# Patient Record
Sex: Male | Born: 1935 | Race: White | Hispanic: No | Smoking: Never smoker
Health system: Southern US, Community
[De-identification: ages and names within clinical notes are randomized; demographics above are authoritative.]

## PROBLEM LIST (undated history)

## (undated) DIAGNOSIS — C679 Malignant neoplasm of bladder, unspecified: Secondary | ICD-10-CM

## (undated) DIAGNOSIS — I10 Essential (primary) hypertension: Secondary | ICD-10-CM

## (undated) DIAGNOSIS — K635 Polyp of colon: Secondary | ICD-10-CM

## (undated) DIAGNOSIS — G5600 Carpal tunnel syndrome, unspecified upper limb: Secondary | ICD-10-CM

## (undated) DIAGNOSIS — F329 Major depressive disorder, single episode, unspecified: Secondary | ICD-10-CM

## (undated) DIAGNOSIS — I482 Chronic atrial fibrillation, unspecified: Secondary | ICD-10-CM

## (undated) DIAGNOSIS — I639 Cerebral infarction, unspecified: Secondary | ICD-10-CM

## (undated) DIAGNOSIS — E785 Hyperlipidemia, unspecified: Secondary | ICD-10-CM

## (undated) DIAGNOSIS — J310 Chronic rhinitis: Secondary | ICD-10-CM

## (undated) DIAGNOSIS — H548 Legal blindness, as defined in USA: Secondary | ICD-10-CM

## (undated) DIAGNOSIS — R42 Dizziness and giddiness: Secondary | ICD-10-CM

## (undated) DIAGNOSIS — R251 Tremor, unspecified: Secondary | ICD-10-CM

## (undated) DIAGNOSIS — F32A Depression, unspecified: Secondary | ICD-10-CM

## (undated) DIAGNOSIS — I2510 Atherosclerotic heart disease of native coronary artery without angina pectoris: Secondary | ICD-10-CM

## (undated) DIAGNOSIS — I739 Peripheral vascular disease, unspecified: Secondary | ICD-10-CM

## (undated) DIAGNOSIS — N400 Enlarged prostate without lower urinary tract symptoms: Secondary | ICD-10-CM

## (undated) DIAGNOSIS — B009 Herpesviral infection, unspecified: Secondary | ICD-10-CM

## (undated) DIAGNOSIS — M1990 Unspecified osteoarthritis, unspecified site: Secondary | ICD-10-CM

## (undated) DIAGNOSIS — I779 Disorder of arteries and arterioles, unspecified: Secondary | ICD-10-CM

## (undated) DIAGNOSIS — H409 Unspecified glaucoma: Secondary | ICD-10-CM

## (undated) HISTORY — DX: Unspecified osteoarthritis, unspecified site: M19.90

## (undated) HISTORY — DX: Benign prostatic hyperplasia without lower urinary tract symptoms: N40.0

## (undated) HISTORY — DX: Malignant neoplasm of bladder, unspecified: C67.9

## (undated) HISTORY — DX: Peripheral vascular disease, unspecified: I73.9

## (undated) HISTORY — DX: Chronic rhinitis: J31.0

## (undated) HISTORY — DX: Essential (primary) hypertension: I10

## (undated) HISTORY — DX: Hyperlipidemia, unspecified: E78.5

## (undated) HISTORY — DX: Tremor, unspecified: R25.1

## (undated) HISTORY — PX: EVISCERATION: SHX1539

## (undated) HISTORY — DX: Disorder of arteries and arterioles, unspecified: I77.9

## (undated) HISTORY — DX: Dizziness and giddiness: R42

## (undated) HISTORY — DX: Carpal tunnel syndrome, unspecified upper limb: G56.00

## (undated) HISTORY — DX: Depression, unspecified: F32.A

## (undated) HISTORY — DX: Legal blindness, as defined in USA: H54.8

## (undated) HISTORY — DX: Chronic atrial fibrillation, unspecified: I48.20

## (undated) HISTORY — DX: Cerebral infarction, unspecified: I63.9

## (undated) HISTORY — DX: Polyp of colon: K63.5

## (undated) HISTORY — PX: TRANSURETHRAL RESECTION OF PROSTATE: SHX73

## (undated) HISTORY — PX: CARPAL TUNNEL RELEASE: SHX101

## (undated) HISTORY — DX: Herpesviral infection, unspecified: B00.9

## (undated) HISTORY — DX: Unspecified glaucoma: H40.9

## (undated) HISTORY — DX: Atherosclerotic heart disease of native coronary artery without angina pectoris: I25.10

## (undated) HISTORY — PX: OTHER SURGICAL HISTORY: SHX169

## (undated) HISTORY — DX: Major depressive disorder, single episode, unspecified: F32.9

## (undated) HISTORY — PX: CATARACT EXTRACTION: SUR2

---

## 2002-09-03 ENCOUNTER — Ambulatory Visit (HOSPITAL_COMMUNITY): Admission: RE | Admit: 2002-09-03 | Discharge: 2002-09-03 | Payer: Self-pay | Admitting: Specialist

## 2003-10-07 ENCOUNTER — Encounter: Admission: RE | Admit: 2003-10-07 | Discharge: 2003-10-07 | Payer: Self-pay | Admitting: Orthopedic Surgery

## 2003-10-08 ENCOUNTER — Ambulatory Visit (HOSPITAL_COMMUNITY): Admission: RE | Admit: 2003-10-08 | Discharge: 2003-10-08 | Payer: Self-pay | Admitting: Orthopedic Surgery

## 2003-10-08 ENCOUNTER — Ambulatory Visit (HOSPITAL_BASED_OUTPATIENT_CLINIC_OR_DEPARTMENT_OTHER): Admission: RE | Admit: 2003-10-08 | Discharge: 2003-10-08 | Payer: Self-pay | Admitting: Orthopedic Surgery

## 2007-09-24 ENCOUNTER — Ambulatory Visit (HOSPITAL_COMMUNITY): Admission: AD | Admit: 2007-09-24 | Discharge: 2007-09-24 | Payer: Self-pay | Admitting: Ophthalmology

## 2008-01-27 ENCOUNTER — Ambulatory Visit (HOSPITAL_COMMUNITY): Admission: RE | Admit: 2008-01-27 | Discharge: 2008-01-27 | Payer: Self-pay | Admitting: Ophthalmology

## 2009-01-08 ENCOUNTER — Emergency Department (HOSPITAL_COMMUNITY): Admission: EM | Admit: 2009-01-08 | Discharge: 2009-01-08 | Payer: Self-pay | Admitting: Emergency Medicine

## 2009-02-19 ENCOUNTER — Emergency Department (HOSPITAL_COMMUNITY): Admission: EM | Admit: 2009-02-19 | Discharge: 2009-02-19 | Payer: Self-pay | Admitting: Emergency Medicine

## 2009-12-19 ENCOUNTER — Inpatient Hospital Stay (HOSPITAL_COMMUNITY): Admission: EM | Admit: 2009-12-19 | Discharge: 2009-12-25 | Payer: Self-pay | Admitting: Emergency Medicine

## 2009-12-21 ENCOUNTER — Encounter (INDEPENDENT_AMBULATORY_CARE_PROVIDER_SITE_OTHER): Payer: Self-pay | Admitting: Neurology

## 2009-12-21 ENCOUNTER — Ambulatory Visit: Payer: Self-pay | Admitting: Physical Medicine & Rehabilitation

## 2010-09-04 LAB — PROTIME-INR
INR: 1.22 (ref 0.00–1.49)
INR: 1.23 (ref 0.00–1.49)
INR: 2.89 — ABNORMAL HIGH (ref 0.00–1.49)
Prothrombin Time: 30 seconds — ABNORMAL HIGH (ref 11.6–15.2)

## 2010-09-04 LAB — BASIC METABOLIC PANEL
CO2: 24 mEq/L (ref 19–32)
CO2: 27 mEq/L (ref 19–32)
Chloride: 104 mEq/L (ref 96–112)
Chloride: 106 mEq/L (ref 96–112)
Chloride: 107 mEq/L (ref 96–112)
Creatinine, Ser: 0.91 mg/dL (ref 0.4–1.5)
Creatinine, Ser: 0.94 mg/dL (ref 0.4–1.5)
GFR calc Af Amer: >60 mL/min (ref >60)
GFR calc Af Amer: >60 mL/min (ref >60)
GFR calc Af Amer: >60 mL/min (ref >60)
GFR calc non Af Amer: >60 mL/min (ref >60)
Potassium: 3.9 mEq/L (ref 3.5–5.1)
Potassium: 4.8 mEq/L (ref 3.5–5.1)
Sodium: 140 mEq/L (ref 135–145)

## 2010-09-04 LAB — CBC
HCT: 40.3 % (ref 39.0–52.0)
Hemoglobin: 13.6 g/dL (ref 13.0–17.0)
Hemoglobin: 15.6 g/dL (ref 13.0–17.0)
MCH: 29.6 pg (ref 26.0–34.0)
MCHC: 34.1 g/dL (ref 30.0–36.0)
MCV: 101.9 fL — ABNORMAL HIGH (ref 78.0–100.0)
MCV: 87.8 fL (ref 78.0–100.0)
Platelets: 106 10*3/uL — ABNORMAL LOW (ref 150–400)
RBC: 3.87 MIL/uL — ABNORMAL LOW (ref 4.22–5.81)
RBC: 3.94 MIL/uL — ABNORMAL LOW (ref 4.22–5.81)
RBC: 3.95 MIL/uL — ABNORMAL LOW (ref 4.22–5.81)
RBC: 5.26 MIL/uL (ref 4.22–5.81)
RDW: 13.8 % (ref 11.5–15.5)
RDW: 14.1 % (ref 11.5–15.5)
WBC: 5.1 10*3/uL (ref 4.0–10.5)
WBC: 5.8 10*3/uL (ref 4.0–10.5)

## 2010-09-04 LAB — COMPREHENSIVE METABOLIC PANEL
ALT: 17 U/L (ref 0–53)
AST: 22 U/L (ref 0–37)
Albumin: 3.5 g/dL (ref 3.5–5.2)
Alkaline Phosphatase: 54 U/L (ref 39–117)
BUN: 29 mg/dL — ABNORMAL HIGH (ref 6–23)
CO2: 26 mEq/L (ref 19–32)
Calcium: 8.8 mg/dL (ref 8.4–10.5)
Chloride: 109 mEq/L (ref 96–112)
Creatinine, Ser: 1.1 mg/dL (ref 0.4–1.5)
GFR calc Af Amer: >60 mL/min (ref >60)
Glucose, Bld: 95 mg/dL (ref 70–99)
Sodium: 141 mEq/L (ref 135–145)
Total Bilirubin: 1 mg/dL (ref 0.3–1.2)
Total Protein: 6.9 g/dL (ref 6.0–8.3)

## 2010-09-04 LAB — DIFFERENTIAL
Basophils Relative: 0 % (ref 0–1)
Eosinophils Absolute: 0.1 10*3/uL (ref 0.0–0.7)
Monocytes Absolute: 0.6 10*3/uL (ref 0.1–1.0)

## 2010-09-04 LAB — APTT: aPTT: 32 seconds (ref 24–37)

## 2010-09-04 LAB — LIPID PANEL: VLDL: 10 mg/dL (ref 0–40)

## 2010-09-04 LAB — GLUCOSE, CAPILLARY: Glucose-Capillary: 96 mg/dL (ref 70–99)

## 2010-09-04 LAB — URINALYSIS, ROUTINE W REFLEX MICROSCOPIC
Bilirubin Urine: NEGATIVE
Ketones, ur: NEGATIVE mg/dL
Specific Gravity, Urine: 1.018 (ref 1.005–1.030)

## 2010-09-04 LAB — HEMOGLOBIN A1C
Hgb A1c MFr Bld: 5.9 % — ABNORMAL HIGH (ref <5.7)
Mean Plasma Glucose: 123 mg/dL — ABNORMAL HIGH (ref <117)

## 2010-09-04 LAB — URINE MICROSCOPIC-ADD ON

## 2010-09-04 LAB — TROPONIN I: Troponin I: 0.06 ng/mL (ref 0.00–0.06)

## 2010-09-04 LAB — CK TOTAL AND CKMB (NOT AT ARMC): CK, MB: 1.9 ng/mL (ref 0.3–4.0)

## 2010-09-23 LAB — URINALYSIS, ROUTINE W REFLEX MICROSCOPIC
Bilirubin Urine: NEGATIVE
Nitrite: NEGATIVE
Specific Gravity, Urine: 1.01 (ref 1.005–1.030)
Urobilinogen, UA: 0.2 mg/dL (ref 0.0–1.0)

## 2010-09-23 LAB — BASIC METABOLIC PANEL
Calcium: 8.9 mg/dL (ref 8.4–10.5)
Creatinine, Ser: 0.85 mg/dL (ref 0.4–1.5)
GFR calc Af Amer: >60 mL/min (ref >60)
GFR calc non Af Amer: >60 mL/min (ref >60)
Glucose, Bld: 106 mg/dL — ABNORMAL HIGH (ref 70–99)
Sodium: 139 mEq/L (ref 135–145)

## 2010-09-23 LAB — DIFFERENTIAL
Basophils Absolute: 0.1 10*3/uL (ref 0.0–0.1)
Lymphocytes Relative: 16 % (ref 12–46)
Monocytes Relative: 10 % (ref 3–12)
Neutro Abs: 4.6 10*3/uL (ref 1.7–7.7)
Neutrophils Relative %: 72 % (ref 43–77)

## 2010-09-23 LAB — PROTIME-INR
INR: 1.2 (ref 0.00–1.49)
Prothrombin Time: 15.1 seconds (ref 11.6–15.2)

## 2010-09-23 LAB — CBC
Hemoglobin: 13.9 g/dL (ref 13.0–17.0)
RDW: 13 % (ref 11.5–15.5)

## 2010-09-23 LAB — APTT: aPTT: 33 seconds (ref 24–37)

## 2010-11-01 NOTE — Op Note (Signed)
NAMEJOSEFF, LUCKMAN               ACCOUNT NO.:  [id number]   MEDICAL RECORD NO.:  [id number]          PATIENT TYPE:  AMB   LOCATION:  SDS                          FACILITY:  [facility]   PHYSICIAN:  Jillyn Hidden A. Rankin, M.D.   DATE OF BIRTH:  Dec 22, 1916   DATE OF PROCEDURE:  09/24/2007  DATE OF DISCHARGE:                               OPERATIVE REPORT   PREOPERATIVE DIAGNOSES:  1. 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  2. Hypopyon, left eye.  3. History history of herpes keratitis.  4. History of aqueous tube shunt in the left eye for intractable      glaucoma.  5. Possible subconjunctival abscess   POSTOPERATIVE DIAGNOSES:  1. 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  2. Hypopyon, left eye.  3. History history of herpes keratitis.  4. History of aqueous tube shunt in the left eye for intractable      glaucoma.  5. There is no apparent subconjunctival abscess although it did appear      to be on initial evaluation to the office.   PROCEDURE:  1. Posterior vitrectomy, left eye.  2. Injection of antibiotics--vancomycin 1.0 mg/0.1 mL concentration,      volume 0.1 mL vitreous and Fortaz 2.25 mg/0.1 mL concentration and      0.1 mL volume vitreous.  3. Posterior synechialysis, left eye.  4. Culture of the anterior chamber and vitreous cavity, left eye.   SURGEON:  Alford Highland. Rankin, MD   ANESTHESIA:  Local retrobulbar anesthesia under good control.   INDICATIONS FOR PROCEDURE:  The patient is an 77 year old man who has  profound vision loss in the left eye from count fingers, normal baseline  count fingers over the last 2 days, now to light perception with near  complete opacification of the cornea of the central corneal disciform-  looking scar, essentially with hypopyon uveitis with diffuse  subconjunctival hemorrhage and what appears to be superotemporally some  conjunctival abscess, loculated.  This an attempt to afford therapeutic  and diagnostic vitrectomy to culture the vitreous  cavity and the  anterior chamber, but also to deliver definitive antibiotics to the eye.  The patient understands the risks of anesthesia, __________ including  worsening condition, as well as surgical repair including, but not  limited to hemorrhage, infection, scarring, need for surgery, and change  of vision, loss of vision, or progressive disease despite intervention.  Appropriate signed consent was obtained.  The patient was taken to the  operating room.   PROCEDURE IN DETAIL:  In the operating room appropriate monitor was  followed by mild sedation.  Xylocaine 2% injected, 5 mL retrobulbar and  an additional 5 mL laterally placed in modified [employer]  The left  periocular region was then sterilely prepped and draped in the usual  ophthalmic fashion.  Lid speculum was applied.  At this time a 25 gauge  trocar was then placed in the inferotemporal quadrant.  Infusion was  then turned on briefly and then it was turned off.  Separate  paracentesis incision was made in the anterior chamber with a 30 gauge  needle and approximately 3 mL of mostly  yellowish fluid was removed from  the anterior chamber.  This was plated directly on sheep blood agar and  chocolate T agar, chocolate blood agar , and chocolate T agar, as well  as to anaerobic medium for transport.  Thereafter the anterior chamber  was reformed with BSS.  The posterior infusion was then turned back on.  At this time separate trocar was placed in the superior temporal  quadrant, away from what appeared to be initially a subconjunctival  abscess in the superotemporal quadrant, but it appeared more likely to  be the old scleral patch over the foot plate of the tube shunt this  patient had a history of having for glaucoma control.   Nonetheless the superotemporal quadrant was avoided for this reason.  The superonasal sclerotomy was then used and just the light microscope  itself was then used to guide this into the visual access  and this could  be seen through the mostly opaque cornea and the modified limited core  vitrectomy was then carried out.  The Biome was not used because it  could not be visualized adequately.  Mostly clear vitreous fluid, but  some yellowish-tinge was noted and this was sent and the cassette was  sent for cultures as well.   After limited core vitrectomy was then done, the superonasal trocar was  removed.  Globe was allowed to soften gently.  Thereafter the infusion  was removed.  The vancomycin and ceftazidime in the concentrations and  volume mentioned before were now injected through vitreous cavity.  Remnants of these antibiotics were placed subconjunctivally.  Notable  findings preoperatively were that the patient had near total  subconjunctival hemorrhage and this did not change during the case.  This did not worsen.   At this time sterile patch and Fox shield applied.  The patient was  taken to PACU to continue receiving his intravenous antibiotics.  The  patient tolerated the procedure without complication.      Alford Highland Rankin, M.D.  Electronically Signed     GAR/MEDQ  D:  09/24/2007  T:  09/24/2007  Job:  [id number]   cc:   Lamarr Lulas, MD

## 2010-11-01 NOTE — Op Note (Signed)
Gregory Vaughn, Gregory Vaughn               ACCOUNT NO.:  [id number]   MEDICAL RECORD NO.:  [id number]          PATIENT TYPE:  AMB   LOCATION:  SDS                          FACILITY:  [facility]   PHYSICIAN:  Alford Highland. Rankin, M.D.   DATE OF BIRTH:  September 13, 1916   DATE OF PROCEDURE:  01/27/2008  DATE OF DISCHARGE:  01/27/2008                               OPERATIVE REPORT   PREOPERATIVE DIAGNOSES:  1. Suspected endophthalmitis, left eye.  2. Hypopyon, anterior chamber of the left eye.  3. 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  4. 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   POSTOPERATIVE DIAGNOSES:  1. Suspected endophthalmitis, left eye.  2. Hypopyon, anterior chamber of the left eye.  3. 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  4. 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   PROCEDURES:  1. Posterior vitrectomy - left eye - 25 gauge.  2. Diagnostic anterior chamber tap, left eye.  3. Injection of intravitreal antibiotics - Fortaz 2.25 mg vitreous      cavity and vancomycin 1.0 mg in the vitreous cavity.   SURGEON:  Alford Highland. Rankin, MD.   SPECIMENS:  Cultures and vitreous washings as well as anterior chamber  tap with Gram-stain from the first washings.   INDICATIONS FOR PROCEDURE:  The patient is an 77 year old man who has  profound vision loss in the left eye from history of herpetic  keratouveitis secondary to glaucomatous changes and has had previous  shunt, aqueous shunt, placed in the past.  The patient has had recurrent  hypopyon now some 4 months after previous anterior chamber washout and  successful vitreous injection of antibiotics.  In the office, in the  past, he has had hand motion to vision as his baseline.  After  discussion with Dr. Eulah Pont, we  were concerned that this might in fact  be endophthalmitis, although it was less likely.  A decision was made to  treat it as an endophthalmitis because of the risk of total loss of the  eye.  Endophthalmitis protocol was instituted.  The patient understands  the need for surgical intervention.  The patient understands the risk to  the eye including, but not limited to hemorrhage, infection, scarring,  need for additional surgery, no change in vision, no loss of vision,  progression of disease despite intervention.   After appropriate signed consent was obtained, the patient was taken to  the operating room.  In the operating room, it must be noted that  preoperatively, we usually do these under MAC, but the patient had an  INR of over 4.3, which presents significant risk for using periocular  anesthesia.  For this reason, we discussed and undertook the use of  general endotracheal anesthesia.  Therefore, in the operating room, the  patient was given mild  sedation and general endotracheal anesthesia  instituted without difficulty.  The left periocular region was sterilely  prepped and draped in the usual sterile fashion.  Lid speculum applied.  A 30-gauge needle with 1-mL syringe was then used to diagnostically tap  the anterior chamber and this was directly plated on to blood agar and  chocolate agar as well as anaerobic transport media.  Thereafter, a  Lewicky Anterior Chamber Maintainer was placed into a new paracentesis  incision in this same cornea of the left eye.  This allowed for  clearance of the anterior chamber and to maintain again the best view  possible to do a limited posterior vitrectomy.   At this time, a superior 25-gauge trocar was applied.  Core vitrectomy  with a BIOM attachment for visualization was then instituted.  The  instruments were kept directly in the visual axis.  The vitreous while  hazy after visualizing through the corneal opacification  and the   anterior chamber inflammation, nonetheless, the vitreous did not appear  to be cellular in debris.  The red reflex was present.  No retinal,  macular, or optic nerve details could be discerned because of the  corneal opacification.  The fluid emanating from the vitreous cavity was  also clear.  At this time, the instruments were removed from the eye and  the trocars were removed.  The Anterior Chamber Maintainer was then  removed and 0.1 mL of Fortaz and vancomycin each separately replaced  into the vitreous cavity.  The concentrations were 2.25 mg/0.1 mL Fortaz  and 1.0 mg/0.1 mL vancomycin for the left eye.  At this time, a sterile  patch and Fox shield were applied.  The patient was awakened from  anesthesia and taken to the recovery room in good and stable condition.  IV antibiotics will be completed.  The patient will be sent home  tonight.  We will see him in followup tomorrow.      Alford Highland Rankin, M.D.  Electronically Signed     GAR/MEDQ  D:  01/27/2008  T:  01/28/2008  Job:  [id number]   cc:   Herby Abraham., M.D.

## 2010-11-04 NOTE — Op Note (Signed)
NAME:  Gregory Vaughn, SHAKESPEARE                         ACCOUNT NO.:  [id number]   MEDICAL RECORD NO.:  [id number]                   PATIENT TYPE:  AMB   LOCATION:  DSC                                  FACILITY:  [facility]   PHYSICIAN:  Katy Fitch. Naaman Plummer., M.D.          DATE OF BIRTH:  1916-11-18   DATE OF PROCEDURE:  10/08/2003  DATE OF DISCHARGE:                                 OPERATIVE REPORT   PREOPERATIVE DIAGNOSIS:  Chronic entrapment neuropathy, right median nerve  and carpal tunnel.   POSTOPERATIVE DIAGNOSIS:  Chronic entrapment neuropathy, right median nerve  and carpal tunnel.   PROCEDURE:  Release of right transverse carpal ligament.   SURGEON:  Katy Fitch. Sypher, M.D.   ASSISTANT:  Jonni Sanger, P.A.   ANESTHESIA:  IV regional at proximal forearm level.   SUPERVISING ANESTHESIOLOGIST:  Kaylyn Layer. Michelle Piper, M.D.   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  Clinical examination suggested severe carpal tunnel syndrome on the right  and moderate carpal tunnel syndrome on the left with possible ulnar  neuropathy on the left.   Electrodiagnostic studies confirmed the presence of bilateral carpal tunnel  syndrome quite severe on the right and subclinical ulnar neuropathy on the  left and left carpal tunnel syndrome.   Due to a failure to respond to non-operative measures he is brought to the  operating room at this time for release of his right transverse carpal  ligament.   Preoperatively, he was advised to discontinue Coumadin which he takes for  chronic atrial fibrillation under the supervision of Dr. Garnette Scheuermann.  He was  noted in the preoperative area to have a slightly elevated Protime, but well  within a range that we should be able to accomplish a minor surgery without  complications.   After informed consent he is brought to the operating room at this time.   DESCRIPTION OF PROCEDURE:  The patient is brought to the operating room and  placed in the supine position on the operating table.  Following placement  of an IV regional block at the proximal forearm level anesthesia was  satisfactory within ten minutes.  The arm was prepped with Betadine soap and  solution and sterilely draped.   The procedure commenced with a short incision in the line of the ring finger  and the palm.  The subcutaneous tissues were carefully divided to reveal the  palmar fascia.  Subdermal vessels were meticulously cauterized with bipolar  forceps followed by splitting of the palmar fascia.  The common extensor  branches of the median nerve were identified and followed back to the nerve  proper.  The nerve was separated from the transverse carpal ligament  followed by release of the ligament with scissors along its ulnar border.   This widely opened the carpal canal.   There was  noted to be a moderate degree of hypertrophic synovitis within the  canal.   Bleeding points along the margin of the released ligament were  electrocauterized with bipolar forceps followed by repair of the skin with  intradermal 3-0 Prolene.   A compressive dressing was applied with Xeroflo, sterile gauze and sterile  Coban followed by release of the tourniquet.   Wound compression was applied for about 7 minutes.   Thereafter, the dressing was completed with a volar plaster splint and an  ace wrap.   There were no apparent complications.   The patient tolerated the surgery and anesthesia well. He was transferred to  the recovery room with stable vital signs.   He will be discharged to the care of his family with prescriptions for  Percocet 5 mg one to two tablets p.o. q.4 to 6h p.r.n. for pain, 20 tablets  without refill. He will resume his Coumadin this afternoon.   We will see him back in seven to ten days for a dressing change.  He will  check in with Dr. Katrinka Blazing regarding his  Protime within seven days.                                               Katy Fitch Naaman Plummer., M.D.    RVS/MEDQ  D:  10/08/2003  T:  10/08/2003  Job:  [id number]   cc:   Lyn Records III, M.D.  [address]  [address]  [place]  [place] [address]  Fax: [phone]   Georgann Housekeeper, M.D.  [address]  [place]  [place] [address]  Fax: [phone]

## 2011-03-14 LAB — CBC
Platelets: 157
RDW: 13.2
WBC: 5.8

## 2011-03-14 LAB — BASIC METABOLIC PANEL
BUN: 13
Calcium: 9.5
Creatinine, Ser: 1.04
GFR calc non Af Amer: >60
Potassium: 4.4

## 2011-03-14 LAB — EYE CULTURE
Culture: NO GROWTH
Culture: NO GROWTH

## 2011-03-14 LAB — HEPATIC FUNCTION PANEL
ALT: 19
AST: 24
Total Protein: 7.6

## 2011-03-14 LAB — GRAM STAIN

## 2011-03-14 LAB — ANAEROBIC CULTURE

## 2011-03-14 LAB — APTT: aPTT: 44 — ABNORMAL HIGH

## 2011-03-14 LAB — PROTIME-INR: INR: 2.6 — ABNORMAL HIGH

## 2011-03-17 LAB — GRAM STAIN

## 2011-03-17 LAB — HEPATIC FUNCTION PANEL
ALT: 17
Alkaline Phosphatase: 55
Bilirubin, Direct: 0.3
Indirect Bilirubin: 0.8
Total Bilirubin: 1.1

## 2011-03-17 LAB — CBC
HCT: 39.6
MCV: 110.4 — ABNORMAL HIGH
RBC: 3.59 — ABNORMAL LOW
WBC: 5.3

## 2011-03-17 LAB — EYE CULTURE: Culture: NO GROWTH

## 2011-03-17 LAB — BASIC METABOLIC PANEL
CO2: 27
Calcium: 8.6
Chloride: 105
GFR calc Af Amer: >60
Glucose, Bld: 102 — ABNORMAL HIGH
Sodium: 139

## 2011-03-17 LAB — ANAEROBIC CULTURE

## 2013-01-09 ENCOUNTER — Emergency Department (HOSPITAL_COMMUNITY): Payer: Medicare Other

## 2013-01-09 ENCOUNTER — Emergency Department (HOSPITAL_COMMUNITY)
Admission: EM | Admit: 2013-01-09 | Discharge: 2013-01-09 | Disposition: A | Discharge status: Home / Self Care | Payer: Medicare Other | Attending: Emergency Medicine | Admitting: Emergency Medicine

## 2013-01-09 ENCOUNTER — Encounter (HOSPITAL_COMMUNITY): Payer: Self-pay | Admitting: Family Medicine

## 2013-01-09 DIAGNOSIS — IMO0002 Reserved for concepts with insufficient information to code with codable children: Secondary | ICD-10-CM | POA: Insufficient documentation

## 2013-01-09 DIAGNOSIS — H9190 Unspecified hearing loss, unspecified ear: Secondary | ICD-10-CM | POA: Insufficient documentation

## 2013-01-09 DIAGNOSIS — Z8679 Personal history of other diseases of the circulatory system: Secondary | ICD-10-CM | POA: Insufficient documentation

## 2013-01-09 DIAGNOSIS — R259 Unspecified abnormal involuntary movements: Secondary | ICD-10-CM | POA: Insufficient documentation

## 2013-01-09 DIAGNOSIS — I499 Cardiac arrhythmia, unspecified: Secondary | ICD-10-CM | POA: Insufficient documentation

## 2013-01-09 DIAGNOSIS — K920 Hematemesis: Secondary | ICD-10-CM | POA: Insufficient documentation

## 2013-01-09 DIAGNOSIS — Z970 Presence of artificial eye: Secondary | ICD-10-CM | POA: Insufficient documentation

## 2013-01-09 DIAGNOSIS — Z79899 Other long term (current) drug therapy: Secondary | ICD-10-CM | POA: Insufficient documentation

## 2013-01-09 DIAGNOSIS — Z880 Allergy status to penicillin: Secondary | ICD-10-CM | POA: Insufficient documentation

## 2013-01-09 DIAGNOSIS — Z7901 Long term (current) use of anticoagulants: Secondary | ICD-10-CM | POA: Insufficient documentation

## 2013-01-09 DIAGNOSIS — Z7982 Long term (current) use of aspirin: Secondary | ICD-10-CM | POA: Insufficient documentation

## 2013-01-09 DIAGNOSIS — H5789 Other specified disorders of eye and adnexa: Secondary | ICD-10-CM | POA: Insufficient documentation

## 2013-01-09 LAB — CBC WITH DIFFERENTIAL/PLATELET
Basophils Absolute: 0 10*3/uL (ref 0.0–0.1)
Basophils Relative: 1 % (ref 0–1)
Eosinophils Absolute: 0.1 10*3/uL (ref 0.0–0.7)
Lymphs Abs: 1.9 10*3/uL (ref 0.7–4.0)
MCH: 32.8 pg (ref 26.0–34.0)
Neutrophils Relative %: 55 % (ref 43–77)
Platelets: 145 10*3/uL — ABNORMAL LOW (ref 150–400)
RBC: 4.3 MIL/uL (ref 4.22–5.81)

## 2013-01-09 LAB — BASIC METABOLIC PANEL
GFR calc Af Amer: 67 mL/min — ABNORMAL LOW (ref >90)
GFR calc non Af Amer: 58 mL/min — ABNORMAL LOW (ref >90)
Glucose, Bld: 85 mg/dL (ref 70–99)
Potassium: 4.3 mEq/L (ref 3.5–5.1)
Sodium: 139 mEq/L (ref 135–145)

## 2013-01-09 LAB — PROTIME-INR
INR: 2.2 — ABNORMAL HIGH (ref 0.00–1.49)
Prothrombin Time: 23.7 seconds — ABNORMAL HIGH (ref 11.6–15.2)

## 2013-01-09 LAB — OCCULT BLOOD, POC DEVICE: Fecal Occult Bld: NEGATIVE

## 2013-01-09 NOTE — ED Provider Notes (Signed)
CSN: [id number]     Arrival date & time 01/09/13  1341 History     First MD Initiated Contact with Patient 01/09/13 1634     Chief Complaint  Patient presents with  . Hemoptysis   (Consider location/radiation/quality/duration/timing/severity/associated sxs/prior Treatment) HPI   79 year old male with history of atrial fibrillation currently on warfarin presents for evaluation of suspect hemoptysis.  Patient states this morning he woke up noticing coffee ground blood on his low and his mouth. He went to the bathroom, and cough up a "black glob" into the sink. No c/o of pain.  Denies fever, headache, lightheadedness, dizziness, sore throat, gum pain, cp, sob, n/v/d, abd pain, back pain, rash, numbness, or weakness.  Never had this sxs before.  Report occasional gum bleeding with small amount but never this much.  Did not see any red blood, just coffee grounded color. Denies taking NSAIDs. Denies hematemesis, hematochezia, or melena.  LBM today with normal color stool. Pt f/u at Urgent Care today for his sxs but was suggested to come to ER for further care.  Pt acknowledged he was exposed to asbestos many years ago, without any significant impact on his health.    Past Medical History  Diagnosis Date  . Atrial fibrillation    History reviewed. No pertinent past surgical history. History reviewed. No pertinent family history. History  Substance Use Topics  . Smoking status: Never Smoker   . Smokeless tobacco: Not on file  . Alcohol Use: No    Review of Systems  All other systems reviewed and are negative.    Allergies  Penicillins  Home Medications   Current Outpatient Rx  Name  Route  Sig  Dispense  Refill  . aspirin 81 MG tablet   Oral   Take 81 mg by mouth daily.         Marland Kitchen atorvastatin (LIPITOR) 20 MG tablet   Oral   Take 20 mg by mouth daily.         . calcitonin, salmon, (MIACALCIN/FORTICAL) 200 UNIT/ACT nasal spray   Nasal   Place 1 spray into the nose  daily.         . dorzolamide-timolol (COSOPT) 22.3-6.8 MG/ML ophthalmic solution   Right Eye   Place 1 drop into the right eye 2 (two) times daily.         Marland Kitchen escitalopram (LEXAPRO) 10 MG tablet   Oral   Take 10 mg by mouth daily.         . fluticasone (FLONASE) 50 MCG/ACT nasal spray   Nasal   Place 2 sprays into the nose daily.         . Glucosamine-Chondroit-Vit C-Mn (GLUCOSAMINE 1500 COMPLEX PO)   Oral   Take 1 tablet by mouth daily.         . isosorbide mononitrate (IMDUR) 30 MG 24 hr tablet   Oral   Take 30 mg by mouth daily.         Marland Kitchen latanoprost (XALATAN) 0.005 % ophthalmic solution   Right Eye   Place 1 drop into the right eye at bedtime.         . metoprolol succinate (TOPROL-XL) 50 MG 24 hr tablet   Oral   Take 50 mg by mouth daily. Take with or immediately following a meal.         . nitroGLYCERIN (NITROSTAT) 0.4 MG SL tablet   Sublingual   Place 0.4 mg under the tongue every 5 (five) minutes as needed for  chest pain.         Marland Kitchen warfarin (COUMADIN) 2.5 MG tablet   Oral   Take 2.5 mg by mouth daily.          BP 151/72  Pulse 118  Temp(Src) 98.3 F (36.8 C)  Resp 18  SpO2 96% Physical Exam  Nursing note and vitals reviewed. Constitutional: He is oriented to person, place, and time. He appears well-developed and well-nourished. No distress.  Patient is hard of hearing  HENT:  Head: Normocephalic and atraumatic.  Mouth/Throat: Oropharynx is clear and moist.  Eyes:  Glass eye in left eye socket, yellow discharge to eyelid  Neck: Neck supple.  Cardiovascular:  Irregularly irregular, without murmurs, rubs, or gallops noted.  Pulmonary/Chest: Effort normal and breath sounds normal.  Abdominal: Soft. Bowel sounds are normal. There is no tenderness.  Genitourinary:  Normal rectal tone, no masses, Hemoccult negative, normal color stool.  Musculoskeletal: Normal range of motion. He exhibits no edema.  Neurological: He is alert and  oriented to person, place, and time.  Pill rolling tremor in most noticeable to right hand.  Skin: No rash noted.  Psychiatric: He has a normal mood and affect.    ED Course   Procedures (including critical care time)  Pt with one episode of coffee ground ?emesis vs hemoptysis.  Currently on warfarin for afib.  CXR shows no focal consolidation concerning for PNA.  No CP/SOB or DOE concerning for PE.  Pt has not cough throughout the day.  No NSAIDs use.  Unsure if coffee ground material could have been from a nose bleed or a gum bleed, or if pt has a GI bleed.  Will obtain basic labs, PT/INR.  Plan to have pt admitted for obs.  Care discussed with attending.    6:00 PM Hemoccult neg, labs are reassuring, PT/INR at therapeutic level.  Pt currently sxs free.  I have called [facility] [phone]), and currently awaits Dr. Waynard Edwards, oncall provider, to call back.    6:11 PM i have consulted oncall doctor, who recommend pt to stop taking aspirin, hold his coumadin dose and to follow up in office at 10:30am.    Labs Reviewed  CBC WITH DIFFERENTIAL - Abnormal; Notable for the following:    Platelets 145 (*)    All other components within normal limits  BASIC METABOLIC PANEL - Abnormal; Notable for the following:    GFR calc non Af Amer 58 (*)    GFR calc Af Amer 67 (*)    All other components within normal limits  PROTIME-INR - Abnormal; Notable for the following:    Prothrombin Time 23.7 (*)    INR 2.20 (*)    All other components within normal limits  OCCULT BLOOD X 1 CARD TO LAB, STOOL  OCCULT BLOOD, POC DEVICE   Dg Chest 2 View  01/09/2013   *RADIOLOGY REPORT*  Clinical Data: Hemoptysis, fever  CHEST - 2 VIEW  Comparison: 09/24/2007; 10/07/2003  Findings:  Grossly unchanged cardiac silhouette and mediastinal contours with mild tortuosity of possible slight ectasia of the thoracic aorta. Atherosclerotic plaque within the thoracic aorta.  The lungs appear hyperexpanded  with flattening of bilateral hemidiaphragms.  There is mild eventration of the medial aspect of the right hemidiaphragm.  Irregular pleural calcification is again noted overlying the anterior aspect of the bilateral mid lungs.  No associated pleural effusion.  No focal airspace opacities.  No pneumothorax.  No definite evidence of edema.  Unchanged bones.  IMPRESSION:  Hyperexpanded lungs and sequela of prior asbestos exposure without definite superimposed acute cardiopulmonary disease.   Original Report Authenticated By: Tacey Ruiz, MD   1. Hematemesis   Hematemesis vs. hemoptysis  MDM  BP 161/69  Pulse 68  Temp(Src) 98.3 F (36.8 C)  Resp 18  SpO2 98%  I have reviewed nursing notes and vital signs. I personally reviewed the imaging tests through PACS system  I reviewed available ER/hospitalization records thought the EMR   Fayrene Helper, [place] 01/09/13 1827

## 2013-01-09 NOTE — ED Provider Notes (Signed)
Medical screening examination/treatment/procedure(s) were conducted as a shared visit with non-physician practitioner(s) and myself.  I personally evaluated the patient during the encounter  Well appearing. No bleeding in approx 13 hours. inr 2.2. Takes his coumadin in the AM but did not take this AM. No hypotension or tachycardia. abd benign. Hemoccult negative. May have been a nose bleed that resulted in single episode this AM at 3am. We spoke with pcp MD on call who agrees with discharge home. He will be seen in the office at 1030AM tomorrow. I personally instructed the pt and friend to return immediately to the ER for any new or worsening symptoms including recurrent bleeding  Lyanne Co, MD 01/09/13 2312

## 2013-01-09 NOTE — ED Notes (Signed)
Per pt sts since last night he has been spitting up blood. sts sent by doctor because he is on blood blood thinners. Denies any other associated symptoms. Denies pain.

## 2013-03-27 ENCOUNTER — Encounter: Payer: Self-pay | Admitting: Interventional Cardiology

## 2013-03-27 ENCOUNTER — Ambulatory Visit (INDEPENDENT_AMBULATORY_CARE_PROVIDER_SITE_OTHER): Payer: Medicare Other | Admitting: Pharmacist

## 2013-03-27 DIAGNOSIS — Z7901 Long term (current) use of anticoagulants: Secondary | ICD-10-CM

## 2013-03-27 DIAGNOSIS — F3289 Other specified depressive episodes: Secondary | ICD-10-CM

## 2013-03-27 DIAGNOSIS — I4891 Unspecified atrial fibrillation: Secondary | ICD-10-CM

## 2013-03-27 DIAGNOSIS — G5600 Carpal tunnel syndrome, unspecified upper limb: Secondary | ICD-10-CM

## 2013-03-27 DIAGNOSIS — M1990 Unspecified osteoarthritis, unspecified site: Secondary | ICD-10-CM

## 2013-03-27 DIAGNOSIS — C679 Malignant neoplasm of bladder, unspecified: Secondary | ICD-10-CM

## 2013-03-27 DIAGNOSIS — F329 Major depressive disorder, single episode, unspecified: Secondary | ICD-10-CM | POA: Insufficient documentation

## 2013-03-27 DIAGNOSIS — E782 Mixed hyperlipidemia: Secondary | ICD-10-CM

## 2013-03-27 DIAGNOSIS — K625 Hemorrhage of anus and rectum: Secondary | ICD-10-CM

## 2013-03-27 DIAGNOSIS — E7800 Pure hypercholesterolemia, unspecified: Secondary | ICD-10-CM

## 2013-03-27 DIAGNOSIS — I2510 Atherosclerotic heart disease of native coronary artery without angina pectoris: Secondary | ICD-10-CM

## 2013-03-27 DIAGNOSIS — I10 Essential (primary) hypertension: Secondary | ICD-10-CM

## 2013-04-01 ENCOUNTER — Encounter: Payer: Self-pay | Admitting: Interventional Cardiology

## 2013-04-01 ENCOUNTER — Ambulatory Visit (INDEPENDENT_AMBULATORY_CARE_PROVIDER_SITE_OTHER): Payer: Medicare Other | Admitting: Interventional Cardiology

## 2013-04-01 VITALS — BP 112/50 | HR 69 | Ht 67.0 in | Wt 162.0 lb

## 2013-04-01 DIAGNOSIS — I4891 Unspecified atrial fibrillation: Secondary | ICD-10-CM

## 2013-04-01 DIAGNOSIS — I2510 Atherosclerotic heart disease of native coronary artery without angina pectoris: Secondary | ICD-10-CM

## 2013-04-01 DIAGNOSIS — I10 Essential (primary) hypertension: Secondary | ICD-10-CM

## 2013-04-01 DIAGNOSIS — Z7901 Long term (current) use of anticoagulants: Secondary | ICD-10-CM

## 2013-04-01 DIAGNOSIS — E782 Mixed hyperlipidemia: Secondary | ICD-10-CM

## 2013-04-01 NOTE — Patient Instructions (Signed)
Your physician recommends that you continue on your current medications as directed. Please refer to the Current Medication list given to you today.  Your physician recommends that you schedule a follow-up appointment in: 1 year with Dr.Smith  Let us know if you develop any bleeding

## 2013-04-01 NOTE — Progress Notes (Signed)
Patient ID: Gregory Vaughn, male   DOB: 1917-04-30, 79 y.o.   MRN: [id number]    HPI He denies any recent angina or cardiac symptoms. Specifically, he denies edema, orthopnea, PND, palpitations, and syncope. Nitroglycerin use is not been required. No bleeding episodes on Coumadin. We discussed activity and protection against falls I could cause head trauma and bleeding. Allergies  Allergen Reactions  . Other Other (See Comments)    Can not take narcotics. Causes hallucinations  . Penicillins Other (See Comments)    unknown    Current Outpatient Prescriptions  Medication Sig Dispense Refill  . aspirin 81 MG tablet Take 81 mg by mouth daily.      Marland Kitchen atorvastatin (LIPITOR) 20 MG tablet Take 20 mg by mouth daily.      . dorzolamide-timolol (COSOPT) 22.3-6.8 MG/ML ophthalmic solution Place 1 drop into the right eye 2 (two) times daily.      Marland Kitchen escitalopram (LEXAPRO) 10 MG tablet Take 10 mg by mouth daily.      . Glucosamine-Chondroit-Vit C-Mn (GLUCOSAMINE 1500 COMPLEX PO) Take 1 tablet by mouth daily.      . isosorbide mononitrate (IMDUR) 30 MG 24 hr tablet Take 30 mg by mouth daily.      Marland Kitchen latanoprost (XALATAN) 0.005 % ophthalmic solution Place 1 drop into the right eye at bedtime.      . metoprolol succinate (TOPROL-XL) 50 MG 24 hr tablet Take 50 mg by mouth daily. Take with or immediately following a meal.      . nitroGLYCERIN (NITROSTAT) 0.4 MG SL tablet Place 0.4 mg under the tongue every 5 (five) minutes as needed for chest pain.      Marland Kitchen warfarin (COUMADIN) 2.5 MG tablet Take 2.5 mg by mouth daily.       No current facility-administered medications for this visit.    Past Medical History  Diagnosis Date  . Hypertension   . Atrial fibrillation     Chronic  . Bladder cancer     remission  . Glaucoma   . Legally blind     left eye  . Carpal tunnel syndrome   . Vertigo   . Dyslipidemia   . Osteoarthritis   . Herpes infection     left eye  . Depression     major  . Rhinitis     . Colon polyp     last eval 04/2007  . BPH (benign prostatic hyperplasia)     Past Surgical History  Procedure Laterality Date  . Cataract extraction    . Retinal operation    . Transurethral resection of prostate    . Carpal tunnel release      2005  . Evisceration      left eye 2010    ROS: Decreased memory. No cardiac complaints. Recent fall. No significant bleeding . PHYSICAL EXAM BP 112/50  Pulse 69  Ht 5\' 7"  (1.702 m)  Wt 162 lb (73.483 kg)  BMI 25.37 kg/m2  Appears his stated age of 79. Frail. No JVD or carotid bruits  Clear chest to auscultation and percussion No edema Decreased memory    EKG: Atrial fibrillation, with controlled ventricular response  ASSESSMENT AND PLAN  1. Chronic atrial fibrillation with good rate control  2. CAD, asymptomatic  3. Hypertension, controlled.  4. Chronic Coumadin therapy, managed by heart Coumadin clinic  Overall, at 79 the patient is stable. No specific evaluation needed.

## 2013-04-24 ENCOUNTER — Ambulatory Visit (INDEPENDENT_AMBULATORY_CARE_PROVIDER_SITE_OTHER): Payer: TRICARE For Life (TFL) | Admitting: Cardiovascular Disease

## 2013-04-24 DIAGNOSIS — Z7901 Long term (current) use of anticoagulants: Secondary | ICD-10-CM

## 2013-04-24 DIAGNOSIS — I4891 Unspecified atrial fibrillation: Secondary | ICD-10-CM

## 2013-05-19 ENCOUNTER — Other Ambulatory Visit: Payer: Self-pay | Admitting: Interventional Cardiology

## 2013-05-22 ENCOUNTER — Ambulatory Visit (INDEPENDENT_AMBULATORY_CARE_PROVIDER_SITE_OTHER): Payer: TRICARE For Life (TFL) | Admitting: Pharmacist

## 2013-05-22 DIAGNOSIS — I4891 Unspecified atrial fibrillation: Secondary | ICD-10-CM

## 2013-05-22 DIAGNOSIS — Z7901 Long term (current) use of anticoagulants: Secondary | ICD-10-CM

## 2013-06-06 ENCOUNTER — Other Ambulatory Visit: Payer: Self-pay | Admitting: *Deleted

## 2013-06-06 MED ORDER — NITROGLYCERIN 0.4 MG SL SUBL: 0.4000 mg | SUBLINGUAL_TABLET | SUBLINGUAL | Status: AC | PRN

## 2013-06-26 ENCOUNTER — Ambulatory Visit (INDEPENDENT_AMBULATORY_CARE_PROVIDER_SITE_OTHER): Payer: TRICARE For Life (TFL) | Admitting: Pharmacist

## 2013-06-26 DIAGNOSIS — I4891 Unspecified atrial fibrillation: Secondary | ICD-10-CM

## 2013-06-26 DIAGNOSIS — Z7901 Long term (current) use of anticoagulants: Secondary | ICD-10-CM

## 2013-06-26 LAB — PROTIME-INR: INR: 2.4 — AB (ref 0.9–1.1)

## 2013-07-21 ENCOUNTER — Other Ambulatory Visit: Payer: Self-pay | Admitting: *Deleted

## 2013-07-21 MED ORDER — METOPROLOL SUCCINATE ER 50 MG PO TB24: 50.0000 mg | ORAL_TABLET | Freq: Every day | ORAL | Status: DC

## 2013-07-24 ENCOUNTER — Ambulatory Visit (INDEPENDENT_AMBULATORY_CARE_PROVIDER_SITE_OTHER): Payer: TRICARE For Life (TFL) | Admitting: Pharmacist

## 2013-07-24 DIAGNOSIS — Z7901 Long term (current) use of anticoagulants: Secondary | ICD-10-CM

## 2013-07-24 DIAGNOSIS — I4891 Unspecified atrial fibrillation: Secondary | ICD-10-CM

## 2013-07-24 LAB — PROTIME-INR: INR: 2 — AB (ref 0.9–1.1)

## 2013-07-25 ENCOUNTER — Other Ambulatory Visit: Payer: Self-pay | Admitting: Interventional Cardiology

## 2013-08-09 ENCOUNTER — Emergency Department (HOSPITAL_BASED_OUTPATIENT_CLINIC_OR_DEPARTMENT_OTHER): Payer: Medicare Other

## 2013-08-09 ENCOUNTER — Encounter (HOSPITAL_BASED_OUTPATIENT_CLINIC_OR_DEPARTMENT_OTHER): Payer: Self-pay | Admitting: Emergency Medicine

## 2013-08-09 ENCOUNTER — Emergency Department (HOSPITAL_BASED_OUTPATIENT_CLINIC_OR_DEPARTMENT_OTHER)
Admission: EM | Admit: 2013-08-09 | Discharge: 2013-08-09 | Disposition: A | Discharge status: Home / Self Care | Payer: Medicare Other | Attending: Emergency Medicine | Admitting: Emergency Medicine

## 2013-08-09 DIAGNOSIS — W19XXXA Unspecified fall, initial encounter: Secondary | ICD-10-CM | POA: Diagnosis present

## 2013-08-09 DIAGNOSIS — Z79899 Other long term (current) drug therapy: Secondary | ICD-10-CM | POA: Insufficient documentation

## 2013-08-09 DIAGNOSIS — I4891 Unspecified atrial fibrillation: Secondary | ICD-10-CM | POA: Insufficient documentation

## 2013-08-09 DIAGNOSIS — S0993XA Unspecified injury of face, initial encounter: Secondary | ICD-10-CM | POA: Insufficient documentation

## 2013-08-09 DIAGNOSIS — E785 Hyperlipidemia, unspecified: Secondary | ICD-10-CM | POA: Insufficient documentation

## 2013-08-09 DIAGNOSIS — S7000XA Contusion of unspecified hip, initial encounter: Secondary | ICD-10-CM | POA: Insufficient documentation

## 2013-08-09 DIAGNOSIS — Z7982 Long term (current) use of aspirin: Secondary | ICD-10-CM | POA: Insufficient documentation

## 2013-08-09 DIAGNOSIS — I10 Essential (primary) hypertension: Secondary | ICD-10-CM | POA: Insufficient documentation

## 2013-08-09 DIAGNOSIS — M1990 Unspecified osteoarthritis, unspecified site: Secondary | ICD-10-CM | POA: Insufficient documentation

## 2013-08-09 DIAGNOSIS — IMO0002 Reserved for concepts with insufficient information to code with codable children: Secondary | ICD-10-CM | POA: Insufficient documentation

## 2013-08-09 DIAGNOSIS — Z880 Allergy status to penicillin: Secondary | ICD-10-CM | POA: Insufficient documentation

## 2013-08-09 DIAGNOSIS — F329 Major depressive disorder, single episode, unspecified: Secondary | ICD-10-CM | POA: Insufficient documentation

## 2013-08-09 DIAGNOSIS — Z8551 Personal history of malignant neoplasm of bladder: Secondary | ICD-10-CM | POA: Insufficient documentation

## 2013-08-09 DIAGNOSIS — Z860100 Personal history of colon polyps, unspecified: Secondary | ICD-10-CM | POA: Insufficient documentation

## 2013-08-09 DIAGNOSIS — S199XXA Unspecified injury of neck, initial encounter: Secondary | ICD-10-CM

## 2013-08-09 DIAGNOSIS — S7001XA Contusion of right hip, initial encounter: Secondary | ICD-10-CM | POA: Diagnosis present

## 2013-08-09 DIAGNOSIS — S0091XA Abrasion of unspecified part of head, initial encounter: Secondary | ICD-10-CM | POA: Diagnosis present

## 2013-08-09 DIAGNOSIS — G319 Degenerative disease of nervous system, unspecified: Secondary | ICD-10-CM | POA: Insufficient documentation

## 2013-08-09 DIAGNOSIS — Z87448 Personal history of other diseases of urinary system: Secondary | ICD-10-CM | POA: Insufficient documentation

## 2013-08-09 DIAGNOSIS — W07XXXA Fall from chair, initial encounter: Secondary | ICD-10-CM | POA: Insufficient documentation

## 2013-08-09 DIAGNOSIS — W1809XA Striking against other object with subsequent fall, initial encounter: Secondary | ICD-10-CM | POA: Insufficient documentation

## 2013-08-09 DIAGNOSIS — Y92009 Unspecified place in unspecified non-institutional (private) residence as the place of occurrence of the external cause: Secondary | ICD-10-CM | POA: Insufficient documentation

## 2013-08-09 DIAGNOSIS — Y9389 Activity, other specified: Secondary | ICD-10-CM | POA: Insufficient documentation

## 2013-08-09 DIAGNOSIS — Z8619 Personal history of other infectious and parasitic diseases: Secondary | ICD-10-CM | POA: Insufficient documentation

## 2013-08-09 DIAGNOSIS — H409 Unspecified glaucoma: Secondary | ICD-10-CM | POA: Insufficient documentation

## 2013-08-09 DIAGNOSIS — Z7901 Long term (current) use of anticoagulants: Secondary | ICD-10-CM | POA: Insufficient documentation

## 2013-08-09 LAB — URINALYSIS, ROUTINE W REFLEX MICROSCOPIC
Bilirubin Urine: NEGATIVE
Glucose, UA: NEGATIVE mg/dL
Hgb urine dipstick: NEGATIVE
KETONES UR: NEGATIVE mg/dL
LEUKOCYTES UA: NEGATIVE
NITRITE: NEGATIVE
PROTEIN: NEGATIVE mg/dL
Specific Gravity, Urine: 1.014 (ref 1.005–1.030)
Urobilinogen, UA: 0.2 mg/dL (ref 0.0–1.0)
pH: 6.5 (ref 5.0–8.0)

## 2013-08-09 LAB — BASIC METABOLIC PANEL
BUN: 14 mg/dL (ref 6–23)
CHLORIDE: 104 meq/L (ref 96–112)
CO2: 28 meq/L (ref 19–32)
CREATININE: 1.1 mg/dL (ref 0.50–1.35)
Calcium: 8.8 mg/dL (ref 8.4–10.5)
GFR calc non Af Amer: 55 mL/min — ABNORMAL LOW (ref >90)
GFR, EST AFRICAN AMERICAN: 64 mL/min — AB (ref >90)
Glucose, Bld: 134 mg/dL — ABNORMAL HIGH (ref 70–99)
Potassium: 4.8 mEq/L (ref 3.7–5.3)
SODIUM: 140 meq/L (ref 137–147)

## 2013-08-09 LAB — CBC WITH DIFFERENTIAL/PLATELET
Basophils Absolute: 0 10*3/uL (ref 0.0–0.1)
Basophils Relative: 0 % (ref 0–1)
EOS ABS: 0.1 10*3/uL (ref 0.0–0.7)
Eosinophils Relative: 3 % (ref 0–5)
HCT: 40.8 % (ref 39.0–52.0)
HEMOGLOBIN: 13.3 g/dL (ref 13.0–17.0)
Lymphocytes Relative: 16 % (ref 12–46)
Lymphs Abs: 0.8 10*3/uL (ref 0.7–4.0)
MCH: 33.2 pg (ref 26.0–34.0)
MCHC: 32.6 g/dL (ref 30.0–36.0)
MCV: 101.7 fL — ABNORMAL HIGH (ref 78.0–100.0)
MONOS PCT: 13 % — AB (ref 3–12)
Monocytes Absolute: 0.7 10*3/uL (ref 0.1–1.0)
NEUTROS ABS: 3.4 10*3/uL (ref 1.7–7.7)
NEUTROS PCT: 68 % (ref 43–77)
PLATELETS: 130 10*3/uL — AB (ref 150–400)
RBC: 4.01 MIL/uL — AB (ref 4.22–5.81)
RDW: 13.3 % (ref 11.5–15.5)
WBC: 5 10*3/uL (ref 4.0–10.5)

## 2013-08-09 LAB — PROTIME-INR
INR: 2.64 — AB (ref 0.00–1.49)
PROTHROMBIN TIME: 27.3 s — AB (ref 11.6–15.2)

## 2013-08-09 MED ORDER — ACETAMINOPHEN 325 MG PO TABS
650.0000 mg | ORAL_TABLET | Freq: Once | ORAL | Status: AC
  Administered 2013-08-09: 650 mg via ORAL
  Filled 2013-08-09: qty 2

## 2013-08-09 NOTE — ED Notes (Addendum)
Patient fell in the house and hit back of head on couch, no loc. Abrasion to back of head, no bleeding. Also complains of right hip pain

## 2013-08-09 NOTE — Discharge Instructions (Signed)
Abrasion °An abrasion is a cut or scrape of the skin. Abrasions do not extend through all layers of the skin and most heal within 10 days. It is important to care for your abrasion properly to prevent infection. °CAUSES  °Most abrasions are caused by falling on, or gliding across, the ground or other surface. When your skin rubs on something, the outer and inner layer of skin rubs off, causing an abrasion. °DIAGNOSIS  °Your caregiver will be able to diagnose an abrasion during a physical exam.  °TREATMENT  °Your treatment depends on how large and deep the abrasion is. Generally, your abrasion will be cleaned with water and a mild soap to remove any dirt or debris. An antibiotic ointment may be put over the abrasion to prevent an infection. A bandage (dressing) may be wrapped around the abrasion to keep it from getting dirty.  °You may need a tetanus shot if: °· You cannot remember when you had your last tetanus shot. °· You have never had a tetanus shot. °· The injury broke your skin. °If you get a tetanus shot, your arm may swell, get red, and feel warm to the touch. This is common and not a problem. If you need a tetanus shot and you choose not to have one, there is a rare chance of getting tetanus. Sickness from tetanus can be serious.  °HOME CARE INSTRUCTIONS  °· If a dressing was applied, change it at least once a day or as directed by your caregiver. If the bandage sticks, soak it off with warm water.   °· Wash the area with water and a mild soap to remove all the ointment 2 times a day. Rinse off the soap and pat the area dry with a clean towel.   °· Reapply any ointment as directed by your caregiver. This will help prevent infection and keep the bandage from sticking. Use gauze over the wound and under the dressing to help keep the bandage from sticking.   °· Change your dressing right away if it becomes wet or dirty.   °· Only take over-the-counter or prescription medicines for pain, discomfort, or fever as  directed by your caregiver.   °· Follow up with your caregiver within 24 48 hours for a wound check, or as directed. If you were not given a wound-check appointment, look closely at your abrasion for redness, swelling, or pus. These are signs of infection. °SEEK IMMEDIATE MEDICAL CARE IF:  °· You have increasing pain in the wound.   °· You have redness, swelling, or tenderness around the wound.   °· You have pus coming from the wound.   °· You have a fever or persistent symptoms for more than 2 3 days. °· You have a fever and your symptoms suddenly get worse. °· You have a bad smell coming from the wound or dressing.   °MAKE SURE YOU:  °· Understand these instructions. °· Will watch your condition. °· Will get help right away if you are not doing well or get worse. °Document Released: 03/15/2005 Document Revised: 05/22/2012 Document Reviewed: 05/09/2011 °[employer]® Patient Information ©2014 [employer]. ° °

## 2013-08-09 NOTE — ED Notes (Signed)
Pt d/c home with family to drive- no rx given- pt cao x 4- ambulatory at d/c

## 2013-08-09 NOTE — ED Provider Notes (Addendum)
CSN: [id number]     Arrival date & time 08/09/13  1233 History   First MD Initiated Contact with Patient 08/09/13 1344     Chief Complaint  Patient presents with  . Fall     (Consider location/radiation/quality/duration/timing/severity/associated sxs/prior Treatment) Patient is a 80 y.o. male presenting with fall. The history is provided by the patient.  Fall This is a new problem. The current episode started 1 to 2 hours ago. Episode frequency: once. The problem has been resolved. Pertinent negatives include no chest pain, no abdominal pain, no headaches and no shortness of breath. Nothing aggravates the symptoms. Nothing relieves the symptoms. He has tried nothing for the symptoms. The treatment provided moderate relief.    Past Medical History  Diagnosis Date  . Hypertension   . Atrial fibrillation     Chronic  . Bladder cancer     remission  . Glaucoma   . Legally blind     left eye  . Carpal tunnel syndrome   . Vertigo   . Dyslipidemia   . Osteoarthritis   . Herpes infection     left eye  . Depression     major  . Rhinitis   . Colon polyp     last eval 04/2007  . BPH (benign prostatic hyperplasia)    Past Surgical History  Procedure Laterality Date  . Cataract extraction    . Retinal operation    . Transurethral resection of prostate    . Carpal tunnel release      2005  . Evisceration      left eye 2010   Family History  Problem Relation Age of Onset  . Heart attack Father    History  Substance Use Topics  . Smoking status: Never Smoker   . Smokeless tobacco: Not on file  . Alcohol Use: Yes    Review of Systems  Constitutional: Negative for fever.  HENT: Negative for drooling and rhinorrhea.   Eyes: Negative for pain.  Respiratory: Negative for cough and shortness of breath.   Cardiovascular: Negative for chest pain and leg swelling.  Gastrointestinal: Negative for nausea, vomiting, abdominal pain and diarrhea.  Genitourinary: Negative for  dysuria and hematuria.  Musculoskeletal: Negative for gait problem and neck pain.  Skin: Negative for color change.  Neurological: Negative for numbness and headaches.  Hematological: Negative for adenopathy.  Psychiatric/Behavioral: Negative for behavioral problems.  All other systems reviewed and are negative.      Allergies  Other and Penicillins  Home Medications   Current Outpatient Rx  Name  Route  Sig  Dispense  Refill  . aspirin 81 MG tablet   Oral   Take 81 mg by mouth daily.         Marland Kitchen atorvastatin (LIPITOR) 20 MG tablet   Oral   Take 20 mg by mouth daily.         . dorzolamide-timolol (COSOPT) 22.3-6.8 MG/ML ophthalmic solution   Right Eye   Place 1 drop into the right eye 2 (two) times daily.         Marland Kitchen escitalopram (LEXAPRO) 10 MG tablet   Oral   Take 10 mg by mouth daily.         . Glucosamine-Chondroit-Vit C-Mn (GLUCOSAMINE 1500 COMPLEX PO)   Oral   Take 1 tablet by mouth daily.         . isosorbide mononitrate (IMDUR) 30 MG 24 hr tablet      TAKE 1 TABLET DAILY  90 tablet   2   . latanoprost (XALATAN) 0.005 % ophthalmic solution   Right Eye   Place 1 drop into the right eye at bedtime.         . metoprolol succinate (TOPROL-XL) 50 MG 24 hr tablet   Oral   Take 1 tablet (50 mg total) by mouth daily. Take with or immediately following a meal.   90 tablet   2   . nitroGLYCERIN (NITROSTAT) 0.4 MG SL tablet   Sublingual   Place 1 tablet (0.4 mg total) under the tongue every 5 (five) minutes as needed for chest pain.   25 tablet   3   . warfarin (COUMADIN) 2.5 MG tablet      Take as directed by coumadin clinic   90 tablet   1     This is a 90 day supply    BP 117/65  Pulse 73  Temp(Src) 98.1 F (36.7 C) (Oral)  Resp 18  SpO2 97% Physical Exam  Nursing note and vitals reviewed. Constitutional: He is oriented to person, place, and time. He appears well-developed and well-nourished.  HENT:  Head: Normocephalic.  Right  Ear: External ear normal.  Left Ear: External ear normal.  Nose: Nose normal.  Mouth/Throat: Oropharynx is clear and moist. No oropharyngeal exudate.  Mild abrasion to the occipital area.  Eyes: Conjunctivae and EOM are normal. Pupils are equal, round, and reactive to light.  Neck: Normal range of motion. Neck supple.  Diffuse nonfocal tenderness to palpation of the cervical spine. No other vertebral tenderness to palpation is noted.  Cardiovascular: Normal rate, regular rhythm, normal heart sounds and intact distal pulses.  Exam reveals no gallop and no friction rub.   No murmur heard. Pulmonary/Chest: Effort normal and breath sounds normal. No respiratory distress. He has no wheezes.  Abdominal: Soft. Bowel sounds are normal. He exhibits no distension. There is no tenderness. There is no rebound and no guarding.  Musculoskeletal: Normal range of motion. He exhibits no edema and no tenderness.  Normal range of motion of left hip without pain.  Mildly limited range of motion of the right hip with mild  pain.  Neurological: He is alert and oriented to person, place, and time. He has normal strength. No sensory deficit.  Skin: Skin is warm and dry.  Psychiatric: He has a normal mood and affect. His behavior is normal.    ED Course  Procedures (including critical care time) Labs Review Labs Reviewed  CBC WITH DIFFERENTIAL - Abnormal; Notable for the following:    RBC 4.01 (*)    MCV 101.7 (*)    Platelets 130 (*)    Monocytes Relative 13 (*)    All other components within normal limits  PROTIME-INR - Abnormal; Notable for the following:    Prothrombin Time 27.3 (*)    INR 2.64 (*)    All other components within normal limits  BASIC METABOLIC PANEL - Abnormal; Notable for the following:    Glucose, Bld 134 (*)    GFR calc non Af Amer 55 (*)    GFR calc Af Amer 64 (*)    All other components within normal limits  URINALYSIS, ROUTINE W REFLEX MICROSCOPIC   Imaging Review Dg Chest  2 View  08/09/2013   CLINICAL DATA:  Fall, history hypertension, bladder cancer, atrial fibrillation  EXAM: CHEST  2 VIEW  COMPARISON:  01/09/2013  FINDINGS: Enlargement of cardiac silhouette.  Calcified tortuous thoracic aorta.  Slight pulmonary vascular congestion.  Hypoinflated lungs with bibasilar atelectasis.  Calcified pleural plaques bilaterally.  Minimal bibasilar atelectasis.  No gross infiltrate, pleural effusion or pneumothorax.  Coronary arterial calcification identified.  Bones demineralized.  IMPRESSION: Enlargement of cardiac silhouette.  Hypoinflated lungs with bibasilar atelectasis and evidence of prior asbestos exposure.   Electronically Signed   By: Lavonia Dana M.D.   On: 08/09/2013 15:01   Dg Hip Complete Right  08/09/2013   CLINICAL DATA:  [place] this morning onto right sign, right hip pain, past history hypertension, bladder cancer  EXAM: RIGHT HIP - COMPLETE 2+ VIEW  COMPARISON:  02/19/2009  FINDINGS: Diffuse osseous demineralization.  Symmetric hip and SI joints.  No acute fracture, dislocation, or bone destruction.  Numerous pelvic phleboliths and extensive atherosclerotic calcifications noted.  IMPRESSION: Osseous demineralization.  No acute right hip abnormalities.   Electronically Signed   By: Lavonia Dana M.D.   On: 08/09/2013 15:00   Ct Head Wo Contrast  08/09/2013   CLINICAL DATA:  [place] [address] and struck the back of his head on a sofa. Abrasion to the back of the head. No loss of consciousness. Neck stiffness.  EXAM: CT HEAD WITHOUT CONTRAST  CT CERVICAL SPINE WITHOUT CONTRAST  TECHNIQUE: Multidetector CT imaging of the head and cervical spine was performed following the standard protocol without intravenous contrast. Multiplanar CT image reconstructions of the cervical spine were also generated.  COMPARISON:  MR HEAD WO/W CM dated 12/21/2009; MR MRA HEAD W/O CM dated 12/21/2009; MR MRA NECK WO/W CM dated 12/21/2009; CT HEAD W/O CM dated 12/19/2009; CT HEAD W/O CM dated 02/19/2009  FINDINGS: CT  HEAD FINDINGS  Moderate to severe cortical and deep atrophy and moderate cerebellar atrophy, unchanged since 2010. Right frontal white matter ischemic changes, new since the prior examinations. No mass lesion. No midline shift. No acute hemorrhage or hematoma. No extra-axial fluid collections. No evidence of acute infarction.  No focal osseous abnormality involving the skull. Opacification of right anterior ethmoid air cells and mild mucosal thickening involving the maxillary sinuses. Remaining visualized paranasal sinuses, bilateral mastoid air cells, and bilateral middle ear cavities well aerated. Extensive bilateral carotid siphon and vertebrobasilar atherosclerosis. Left globe prosthesis.  CT CERVICAL SPINE FINDINGS  No fractures identified involving the cervical spine. Facet joints intact with severe degenerative changes throughout. Degenerative 3-4 mm spondylolisthesis of C3 relative to C2, resulting in borderline to mild spinal stenosis at this level. Calcified pannus posterior to the dens, without evidence of spinal stenosis. Sagittal reconstructed images demonstrate severe disc space narrowing and endplate hypertrophic changes at C3-4, C5-6, and C6-7, worst at C3-4. Coronal images demonstrate an intact craniocervical junction, intact C1-C2 articulation, and intact dens. Combination of facet and uncinate hypertrophy account for multilevel foraminal stenoses including moderate bilateral C2-3, severe bilateral C3-4, moderate bilateral C4-5, severe bilateral C5-6, and moderate right and severe left C6-7.  IMPRESSION: CT Head:  1. No acute intracranial abnormality. 2. Right frontal white matter ischemic changes, new since 2011, but not acute in appearance. 3. Stable moderate to severe generalized atrophy. 4. Right anterior ethmoid sinus disease.  CT Cervical Spine:  1. No cervical spine fractures identified. 2. Severe degenerative changes as detailed above, including degenerative spondylolisthesis of C3 relative  to C2 of on the order of 3-4 mm resulting in borderline to mild spinal stenosis at this level.   Electronically Signed   By: Evangeline Dakin M.D.   On: 08/09/2013 15:22   Ct Cervical Spine Wo Contrast  08/09/2013   CLINICAL DATA:  [place] [address]  and struck the back of his head on a sofa. Abrasion to the back of the head. No loss of consciousness. Neck stiffness.  EXAM: CT HEAD WITHOUT CONTRAST  CT CERVICAL SPINE WITHOUT CONTRAST  TECHNIQUE: Multidetector CT imaging of the head and cervical spine was performed following the standard protocol without intravenous contrast. Multiplanar CT image reconstructions of the cervical spine were also generated.  COMPARISON:  MR HEAD WO/W CM dated 12/21/2009; MR MRA HEAD W/O CM dated 12/21/2009; MR MRA NECK WO/W CM dated 12/21/2009; CT HEAD W/O CM dated 12/19/2009; CT HEAD W/O CM dated 02/19/2009  FINDINGS: CT HEAD FINDINGS  Moderate to severe cortical and deep atrophy and moderate cerebellar atrophy, unchanged since 2010. Right frontal white matter ischemic changes, new since the prior examinations. No mass lesion. No midline shift. No acute hemorrhage or hematoma. No extra-axial fluid collections. No evidence of acute infarction.  No focal osseous abnormality involving the skull. Opacification of right anterior ethmoid air cells and mild mucosal thickening involving the maxillary sinuses. Remaining visualized paranasal sinuses, bilateral mastoid air cells, and bilateral middle ear cavities well aerated. Extensive bilateral carotid siphon and vertebrobasilar atherosclerosis. Left globe prosthesis.  CT CERVICAL SPINE FINDINGS  No fractures identified involving the cervical spine. Facet joints intact with severe degenerative changes throughout. Degenerative 3-4 mm spondylolisthesis of C3 relative to C2, resulting in borderline to mild spinal stenosis at this level. Calcified pannus posterior to the dens, without evidence of spinal stenosis. Sagittal reconstructed images demonstrate severe disc  space narrowing and endplate hypertrophic changes at C3-4, C5-6, and C6-7, worst at C3-4. Coronal images demonstrate an intact craniocervical junction, intact C1-C2 articulation, and intact dens. Combination of facet and uncinate hypertrophy account for multilevel foraminal stenoses including moderate bilateral C2-3, severe bilateral C3-4, moderate bilateral C4-5, severe bilateral C5-6, and moderate right and severe left C6-7.  IMPRESSION: CT Head:  1. No acute intracranial abnormality. 2. Right frontal white matter ischemic changes, new since 2011, but not acute in appearance. 3. Stable moderate to severe generalized atrophy. 4. Right anterior ethmoid sinus disease.  CT Cervical Spine:  1. No cervical spine fractures identified. 2. Severe degenerative changes as detailed above, including degenerative spondylolisthesis of C3 relative to C2 of on the order of 3-4 mm resulting in borderline to mild spinal stenosis at this level.   Electronically Signed   By: Evangeline Dakin M.D.   On: 08/09/2013 15:22    EKG Interpretation    Date/Time:  Saturday August 09 2013 14:13:12 EST Ventricular Rate:  67 PR Interval:    QRS Duration: 72 QT Interval:  396 QTC Calculation: 418 R Axis:   68 Text Interpretation:  Atrial fibrillation non-specific t wave changes Confirmed by Bibi Economos  MD, Filimon Miranda ([id number]) on 08/09/2013 2:41:48 PM            MDM   Final diagnoses:  Fall  Abrasion of head  Contusion of right hip    2:03 PM 80 y.o. male w a hx a afib on coumadin who presents with a fall which occurred prior to arrival. The patient states that he was getting up from a chair when he fell to the side landing on his right hip and hitting his head on the couch. He is not sure why he fell but believes it to be mechanical. He denies syncope or loc. The even was not witnessed, but his wife states she heard him singing in the other room and then heard a thump when he fell. He is afebrile  and vital signs are  unremarkable here. He has minimal right hip pain on exam. He denies headache. Will get screening labs and imaging. Tylenol for pain. Wife believes tetanus to be UTD w/in the last 2 years.   Abrasion of scalp cleaned w/ Saf-cleans. Bacitracin applied.   3:53 PM: I interpreted/reviewed the labs and/or imaging which were non-contributory. Pt is ambulatory w/ minimal assistance. Wife states he uses a walker at home. He continues to appear well and has no complaints. I have discussed the diagnosis/risks/treatment options with the patient and family and believe the pt to be eligible for discharge home to follow-up with pcp next week. We also discussed returning to the ED immediately if new or worsening sx occur. We discussed the sx which are most concerning (e.g., HA, AMS, recurrent falls) that necessitate immediate return. Medications administered to the patient during their visit and any new prescriptions provided to the patient are listed below.  Medications given during this visit Medications  acetaminophen (TYLENOL) tablet 650 mg (650 mg Oral Given 08/09/13 1413)    New Prescriptions   No medications on file     Blanchard Kelch, MD 08/09/13 1936  Blanchard Kelch, MD 08/09/13 [phone]

## 2013-08-21 ENCOUNTER — Ambulatory Visit (INDEPENDENT_AMBULATORY_CARE_PROVIDER_SITE_OTHER): Payer: TRICARE For Life (TFL) | Admitting: Pharmacist

## 2013-08-21 DIAGNOSIS — I4891 Unspecified atrial fibrillation: Secondary | ICD-10-CM

## 2013-08-21 DIAGNOSIS — Z7901 Long term (current) use of anticoagulants: Secondary | ICD-10-CM

## 2013-08-21 LAB — POCT INR: INR: 3.31

## 2013-09-04 ENCOUNTER — Ambulatory Visit (INDEPENDENT_AMBULATORY_CARE_PROVIDER_SITE_OTHER): Payer: TRICARE For Life (TFL) | Admitting: Pharmacist

## 2013-09-04 DIAGNOSIS — I4891 Unspecified atrial fibrillation: Secondary | ICD-10-CM

## 2013-09-04 DIAGNOSIS — Z7901 Long term (current) use of anticoagulants: Secondary | ICD-10-CM

## 2013-09-04 LAB — POCT INR: INR: 2.42

## 2013-09-18 ENCOUNTER — Ambulatory Visit (INDEPENDENT_AMBULATORY_CARE_PROVIDER_SITE_OTHER): Payer: TRICARE For Life (TFL) | Admitting: Internal Medicine

## 2013-09-18 DIAGNOSIS — I4891 Unspecified atrial fibrillation: Secondary | ICD-10-CM

## 2013-09-18 DIAGNOSIS — Z7901 Long term (current) use of anticoagulants: Secondary | ICD-10-CM

## 2013-09-18 LAB — PROTIME-INR: INR: 2.3 — AB (ref 0.9–1.1)

## 2013-10-09 ENCOUNTER — Ambulatory Visit (INDEPENDENT_AMBULATORY_CARE_PROVIDER_SITE_OTHER): Payer: TRICARE For Life (TFL) | Admitting: Cardiovascular Disease

## 2013-10-09 DIAGNOSIS — I4891 Unspecified atrial fibrillation: Secondary | ICD-10-CM

## 2013-10-09 DIAGNOSIS — Z7901 Long term (current) use of anticoagulants: Secondary | ICD-10-CM

## 2013-10-09 DIAGNOSIS — Z5181 Encounter for therapeutic drug level monitoring: Secondary | ICD-10-CM

## 2013-10-09 LAB — PROTIME-INR: INR: 2.4 — AB (ref 0.9–1.1)

## 2013-10-12 ENCOUNTER — Other Ambulatory Visit: Payer: Self-pay | Admitting: Interventional Cardiology

## 2013-11-06 ENCOUNTER — Ambulatory Visit (INDEPENDENT_AMBULATORY_CARE_PROVIDER_SITE_OTHER): Payer: TRICARE For Life (TFL) | Admitting: Internal Medicine

## 2013-11-06 DIAGNOSIS — Z5181 Encounter for therapeutic drug level monitoring: Secondary | ICD-10-CM

## 2013-11-06 DIAGNOSIS — I4891 Unspecified atrial fibrillation: Secondary | ICD-10-CM

## 2013-11-06 LAB — PROTIME-INR: INR: 2.3 — AB (ref 0.9–1.1)

## 2013-12-04 ENCOUNTER — Ambulatory Visit (INDEPENDENT_AMBULATORY_CARE_PROVIDER_SITE_OTHER): Payer: TRICARE For Life (TFL) | Admitting: Cardiovascular Disease

## 2013-12-04 DIAGNOSIS — Z5181 Encounter for therapeutic drug level monitoring: Secondary | ICD-10-CM

## 2013-12-04 LAB — PROTIME-INR: INR: 2.5 — AB (ref 0.9–1.1)

## 2014-01-15 ENCOUNTER — Ambulatory Visit (INDEPENDENT_AMBULATORY_CARE_PROVIDER_SITE_OTHER): Payer: TRICARE For Life (TFL) | Admitting: Pharmacist

## 2014-01-15 DIAGNOSIS — I4891 Unspecified atrial fibrillation: Secondary | ICD-10-CM

## 2014-01-15 DIAGNOSIS — Z5181 Encounter for therapeutic drug level monitoring: Secondary | ICD-10-CM

## 2014-01-15 LAB — PROTIME-INR: INR: 2.4 — AB (ref 0.9–1.1)

## 2014-03-06 ENCOUNTER — Other Ambulatory Visit: Payer: Self-pay | Admitting: Interventional Cardiology

## 2014-03-10 ENCOUNTER — Emergency Department (HOSPITAL_BASED_OUTPATIENT_CLINIC_OR_DEPARTMENT_OTHER)
Admission: EM | Admit: 2014-03-10 | Discharge: 2014-03-10 | Disposition: A | Discharge status: Home / Self Care | Payer: Medicare Other | Attending: Emergency Medicine | Admitting: Emergency Medicine

## 2014-03-10 ENCOUNTER — Emergency Department (HOSPITAL_BASED_OUTPATIENT_CLINIC_OR_DEPARTMENT_OTHER): Payer: Medicare Other

## 2014-03-10 ENCOUNTER — Encounter (HOSPITAL_BASED_OUTPATIENT_CLINIC_OR_DEPARTMENT_OTHER): Payer: Self-pay | Admitting: Emergency Medicine

## 2014-03-10 ENCOUNTER — Other Ambulatory Visit: Payer: Self-pay | Admitting: Interventional Cardiology

## 2014-03-10 DIAGNOSIS — W19XXXA Unspecified fall, initial encounter: Secondary | ICD-10-CM

## 2014-03-10 DIAGNOSIS — E785 Hyperlipidemia, unspecified: Secondary | ICD-10-CM | POA: Diagnosis not present

## 2014-03-10 DIAGNOSIS — R296 Repeated falls: Secondary | ICD-10-CM | POA: Insufficient documentation

## 2014-03-10 DIAGNOSIS — Z8551 Personal history of malignant neoplasm of bladder: Secondary | ICD-10-CM | POA: Insufficient documentation

## 2014-03-10 DIAGNOSIS — M1990 Unspecified osteoarthritis, unspecified site: Secondary | ICD-10-CM | POA: Diagnosis not present

## 2014-03-10 DIAGNOSIS — X500XXA Overexertion from strenuous movement or load, initial encounter: Secondary | ICD-10-CM | POA: Diagnosis not present

## 2014-03-10 DIAGNOSIS — Y9389 Activity, other specified: Secondary | ICD-10-CM | POA: Diagnosis not present

## 2014-03-10 DIAGNOSIS — Z860100 Personal history of colon polyps, unspecified: Secondary | ICD-10-CM | POA: Insufficient documentation

## 2014-03-10 DIAGNOSIS — T148XXA Other injury of unspecified body region, initial encounter: Secondary | ICD-10-CM

## 2014-03-10 DIAGNOSIS — Z7982 Long term (current) use of aspirin: Secondary | ICD-10-CM | POA: Insufficient documentation

## 2014-03-10 DIAGNOSIS — I10 Essential (primary) hypertension: Secondary | ICD-10-CM | POA: Insufficient documentation

## 2014-03-10 DIAGNOSIS — Z79899 Other long term (current) drug therapy: Secondary | ICD-10-CM | POA: Insufficient documentation

## 2014-03-10 DIAGNOSIS — IMO0002 Reserved for concepts with insufficient information to code with codable children: Secondary | ICD-10-CM | POA: Diagnosis present

## 2014-03-10 DIAGNOSIS — S20229A Contusion of unspecified back wall of thorax, initial encounter: Secondary | ICD-10-CM | POA: Insufficient documentation

## 2014-03-10 DIAGNOSIS — Z7901 Long term (current) use of anticoagulants: Secondary | ICD-10-CM | POA: Insufficient documentation

## 2014-03-10 DIAGNOSIS — Y929 Unspecified place or not applicable: Secondary | ICD-10-CM | POA: Insufficient documentation

## 2014-03-10 DIAGNOSIS — I4891 Unspecified atrial fibrillation: Secondary | ICD-10-CM | POA: Insufficient documentation

## 2014-03-10 DIAGNOSIS — F3289 Other specified depressive episodes: Secondary | ICD-10-CM | POA: Diagnosis not present

## 2014-03-10 DIAGNOSIS — Z8709 Personal history of other diseases of the respiratory system: Secondary | ICD-10-CM | POA: Insufficient documentation

## 2014-03-10 DIAGNOSIS — Z880 Allergy status to penicillin: Secondary | ICD-10-CM | POA: Insufficient documentation

## 2014-03-10 DIAGNOSIS — Z8619 Personal history of other infectious and parasitic diseases: Secondary | ICD-10-CM | POA: Diagnosis not present

## 2014-03-10 DIAGNOSIS — Z87448 Personal history of other diseases of urinary system: Secondary | ICD-10-CM | POA: Insufficient documentation

## 2014-03-10 DIAGNOSIS — F329 Major depressive disorder, single episode, unspecified: Secondary | ICD-10-CM | POA: Diagnosis not present

## 2014-03-10 LAB — URINALYSIS, ROUTINE W REFLEX MICROSCOPIC
Bilirubin Urine: NEGATIVE
Glucose, UA: NEGATIVE mg/dL
Hgb urine dipstick: NEGATIVE
Ketones, ur: NEGATIVE mg/dL
Leukocytes, UA: NEGATIVE
NITRITE: NEGATIVE
PH: 6 (ref 5.0–8.0)
Protein, ur: NEGATIVE mg/dL
SPECIFIC GRAVITY, URINE: 1.015 (ref 1.005–1.030)
Urobilinogen, UA: 0.2 mg/dL (ref 0.0–1.0)

## 2014-03-10 MED ORDER — DIAZEPAM 2 MG PO TABS
2.0000 mg | ORAL_TABLET | Freq: Once | ORAL | Status: AC
  Administered 2014-03-10: 2 mg via ORAL
  Filled 2014-03-10: qty 1

## 2014-03-10 MED ORDER — DIAZEPAM 2 MG PO TABS
2.0000 mg | ORAL_TABLET | Freq: Three times a day (TID) | ORAL | Status: DC | PRN
Start: 2014-03-10 — End: 2014-04-17

## 2014-03-10 NOTE — ED Notes (Signed)
Pt's friend reports he fell into a wall on Sunday - c/o right side back pain with movement- pain resolved with rest

## 2014-03-10 NOTE — Discharge Instructions (Signed)
Contusion °A contusion is a deep bruise. Contusions are the result of an injury that caused bleeding under the skin. The contusion may turn blue, purple, or yellow. Minor injuries will give you a painless contusion, but more severe contusions may stay painful and swollen for a few weeks.  °CAUSES  °A contusion is usually caused by a blow, trauma, or direct force to an area of the body. °SYMPTOMS  °· Swelling and redness of the injured area. °· Bruising of the injured area. °· Tenderness and soreness of the injured area. °· Pain. °DIAGNOSIS  °The diagnosis can be made by taking a history and physical exam. An X-ray, CT scan, or MRI may be needed to determine if there were any associated injuries, such as fractures. °TREATMENT  °Specific treatment will depend on what area of the body was injured. In general, the best treatment for a contusion is resting, icing, elevating, and applying cold compresses to the injured area. Over-the-counter medicines may also be recommended for pain control. Ask your caregiver what the best treatment is for your contusion. °HOME CARE INSTRUCTIONS  °· Put ice on the injured area. °¨ Put ice in a plastic bag. °¨ Place a towel between your skin and the bag. °¨ Leave the ice on for 15-20 minutes, 3-4 times a day, or as directed by your health care provider. °· Only take over-the-counter or prescription medicines for pain, discomfort, or fever as directed by your caregiver. Your caregiver may recommend avoiding anti-inflammatory medicines (aspirin, ibuprofen, and naproxen) for 48 hours because these medicines may increase bruising. °· Rest the injured area. °· If possible, elevate the injured area to reduce swelling. °SEEK IMMEDIATE MEDICAL CARE IF:  °· You have increased bruising or swelling. °· You have pain that is getting worse. °· Your swelling or pain is not relieved with medicines. °MAKE SURE YOU:  °· Understand these instructions. °· Will watch your condition. °· Will get help right  away if you are not doing well or get worse. °Document Released: 03/15/2005 Document Revised: 06/10/2013 Document Reviewed: 04/10/2011 °[employer]® Patient Information ©2015 [employer]. This information is not intended to replace advice given to you by your health care provider. Make sure you discuss any questions you have with your health care provider. ° °

## 2014-03-10 NOTE — ED Provider Notes (Signed)
CSN: [id number]     Arrival date & time 03/10/14  1849 History  This chart was scribed for Debby Freiberg, MD by Peyton Bottoms, ED Scribe. This patient was seen in room MH02/MH02 and the patient's care was started at 7:30 PM.   Chief Complaint  Patient presents with  . Fall   Patient is a 80 y.o. male presenting with fall. The history is provided by the patient and the spouse. No language interpreter was used.  Fall This is a new problem. The current episode started 2 days ago. The problem has been gradually worsening. Pertinent negatives include no chest pain, no abdominal pain, no headaches and no shortness of breath. The symptoms are aggravated by exertion and twisting. The symptoms are relieved by rest. He has tried nothing for the symptoms.   HPI Comments: Gregory Vaughn is a 80 y.o. male who presents to the Emergency Department complaining of midline and right sided back pain that began 1 day ago due to a fall that occurred 2 days ago. Patient states that he was sitting on a recliner chair and twisted over the chair to look for his prosthetic eye on the floor and rolled out of the chair as he fell. Patient states that his pain is worsened with movement and resolved with sitting and rest. He denies head impact or LOC, and describes the mechanism as less of a fall and more of a constant scraping, as there was not room enough for him to fall between the chair and the wall.  He denies associated headache, nausea, vomiting or vision changes, as well as urinary retention or other symptoms. Per spouse, patient has had loose bowel movements and has 3 bowel movements for the past 2 weeks, however has not had any concern for syncope.   Past Medical History  Diagnosis Date  . Hypertension   . Atrial fibrillation     Chronic  . Bladder cancer     remission  . Glaucoma   . Legally blind     left eye  . Carpal tunnel syndrome   . Vertigo   . Dyslipidemia   . Osteoarthritis   . Herpes infection      left eye  . Depression     major  . Rhinitis   . Colon polyp     last eval 04/2007  . BPH (benign prostatic hyperplasia)    Past Surgical History  Procedure Laterality Date  . Cataract extraction    . Retinal operation    . Transurethral resection of prostate    . Carpal tunnel release      2005  . Evisceration      left eye 2010   Family History  Problem Relation Age of Onset  . Heart attack Father    History  Substance Use Topics  . Smoking status: Never Smoker   . Smokeless tobacco: Not on file  . Alcohol Use: Yes    Review of Systems  Constitutional: Negative for fever.  Eyes: Negative for visual disturbance.  Respiratory: Negative for shortness of breath.   Cardiovascular: Negative for chest pain.  Gastrointestinal: Negative for nausea, vomiting, abdominal pain and diarrhea.  Genitourinary: Negative for dysuria.  Musculoskeletal: Positive for back pain and myalgias.  Skin: Negative for wound.  Neurological: Negative for headaches.  All other systems reviewed and are negative.  Allergies  Other and Penicillins  Home Medications   Prior to Admission medications   Medication Sig Start Date End Date Taking? Authorizing  Provider  aspirin 81 MG tablet Take 81 mg by mouth daily.    Historical Provider, MD  atorvastatin (LIPITOR) 20 MG tablet Take 20 mg by mouth daily.    Historical Provider, MD  diazepam (VALIUM) 2 MG tablet Take 1 tablet (2 mg total) by mouth every 8 (eight) hours as needed for muscle spasms. 03/10/14   Debby Freiberg, MD  dorzolamide-timolol (COSOPT) 22.3-6.8 MG/ML ophthalmic solution Place 1 drop into the right eye 2 (two) times daily.    Historical Provider, MD  escitalopram (LEXAPRO) 10 MG tablet Take 10 mg by mouth daily.    Historical Provider, MD  Glucosamine-Chondroit-Vit C-Mn (GLUCOSAMINE 1500 COMPLEX PO) Take 1 tablet by mouth daily.    Historical Provider, MD  isosorbide mononitrate (IMDUR) 30 MG 24 hr tablet TAKE 1 TABLET DAILY  07/25/13   Belva Crome III, MD  latanoprost (XALATAN) 0.005 % ophthalmic solution Place 1 drop into the right eye at bedtime.    Historical Provider, MD  metoprolol succinate (TOPROL-XL) 50 MG 24 hr tablet TAKE 1 TABLET DAILY, TAKE WITH OR IMMEDIATELY FOLLOWING A MEAL 03/10/14   Belva Crome III, MD  nitroGLYCERIN (NITROSTAT) 0.4 MG SL tablet Place 1 tablet (0.4 mg total) under the tongue every 5 (five) minutes as needed for chest pain. 06/06/13   Belva Crome III, MD  warfarin (COUMADIN) 2.5 MG tablet TAKE AS DIRECTED BY COUMADIN CLINIC 03/06/14   Belva Crome III, MD   Triage Vitals: BP 136/60  Pulse 70  Temp(Src) 97.8 F (36.6 C) (Oral)  Resp 20  SpO2 97%  Physical Exam  Nursing note and vitals reviewed. Constitutional: He is oriented to person, place, and time. He appears well-developed and well-nourished. No distress.  HENT:  Head: Normocephalic and atraumatic.  Eyes: Conjunctivae and EOM are normal.  Neck: Normal range of motion. Neck supple. No tracheal deviation present.  Cardiovascular: Normal rate, regular rhythm and normal heart sounds.   Pulmonary/Chest: Effort normal and breath sounds normal. No respiratory distress.  Abdominal: He exhibits no distension. There is no tenderness. There is no rebound and no guarding.  Musculoskeletal: Normal range of motion. He exhibits tenderness.  Lower thoracic, upper lumbar midline tenderness.   Neurological: He is alert and oriented to person, place, and time.  Skin: Skin is warm and dry.  Psychiatric: He has a normal mood and affect. His behavior is normal.   ED Course  Procedures (including critical care time)  DIAGNOSTIC STUDIES: Oxygen Saturation is 97% on RA, normal by my interpretation.    COORDINATION OF CARE: 7:39 PM- Discussed plans to order diagnostic imaging and will give medications for pain management. Pt advised of plan for treatment and pt agrees.  Labs Review Labs Reviewed  URINALYSIS, ROUTINE W REFLEX  MICROSCOPIC    Imaging Review Dg Chest 2 View  03/10/2014   CLINICAL DATA:  Patient fell on Sunday.  Injured upper back.  EXAM: CHEST  2 VIEW  COMPARISON:  08/09/2013  FINDINGS: There are bilateral calcified pleural plaques. There is no focal parenchymal opacity, pleural effusion, or pneumothorax. The heart and mediastinal contours are unremarkable. There is thoracic aortic atherosclerosis.  The osseous structures are unremarkable.  IMPRESSION: No active cardiopulmonary disease.  Bilateral calcified pleural plaques as can be seen with prior asbestos exposure.   Electronically Signed   By: Kathreen Devoid   On: 03/10/2014 20:27   Dg Thoracic Spine 2 View  03/10/2014   CLINICAL DATA:  Fall, back pain  EXAM:  THORACIC SPINE - 2 VIEW  COMPARISON:  Lumbar spine radiographs are dictated separately today. Chest radiograph 01/09/2013.  FINDINGS: T12 compression deformity reidentified. Alignment is normal. No other significant bone abnormalities are identified. Lumbar spine disc degenerative change partly visualized. Atheromatous aortic calcification and ectasia noted without calcified aneurysm.  IMPRESSION: No acute abnormality.   Electronically Signed   By: Conchita Paris M.D.   On: 03/10/2014 20:27   Dg Lumbar Spine Complete  03/10/2014   CLINICAL DATA:  Fall, right low back pain  EXAM: LUMBAR SPINE - COMPLETE 4+ VIEW  COMPARISON:  Prior exams, including chest radiograph 01/09/2013  FINDINGS: T12 compression deformity reidentified. L1 compression deformity is stable when allowing for differences in technique. Atheromatous aortic calcification without calcified aneurysm. Multilevel disc degenerative change identified.  IMPRESSION: Stable T12 and L1 compression deformities.   Electronically Signed   By: Conchita Paris M.D.   On: 03/10/2014 20:29     EKG Interpretation None     MDM   Final diagnoses:  Fall, initial encounter  Contusion    80 y.o. male with pertinent PMH of HTN presents with midline and  R paraspinal back pain after a twisting accident occuring from chair.  He denies recent easy bruising or symptoms with exception of pain which is easily exacerbated by movement and palpation. Patient has a history of prior lumbar and thoracic fractures. He specifically denies any recent trauma with the exception of the scraping injury described above. On arrival vital signs and physical exam as above significant for thoracic and lumbar midline tenderness with paraspinal tenderness. The patient was given 2 mg of Valium and had total relief of all symptoms and was able to stand and ambulate unassisted.  The nature of his pain appears consistent with muscle spasm, and he has no concussive signs. Additionally there are no signs of occult intra-abdominal pathology such as renal hematoma (patient has negative UA) or AAA.  The patient and his family were given strict return precautions given his history of Coumadin use and age, they voiced understanding and agreed with plan and assured me they would follow up with any other symptoms develop.  1. Fall, initial encounter   2. Contusion       I personally performed the services described in this documentation, which was scribed in my presence. The recorded information has been reviewed and is accurate.    Debby Freiberg, MD 03/11/14 [phone]

## 2014-03-12 ENCOUNTER — Ambulatory Visit (INDEPENDENT_AMBULATORY_CARE_PROVIDER_SITE_OTHER): Payer: TRICARE For Life (TFL) | Admitting: Pharmacist

## 2014-03-12 DIAGNOSIS — Z5181 Encounter for therapeutic drug level monitoring: Secondary | ICD-10-CM

## 2014-03-12 DIAGNOSIS — I4891 Unspecified atrial fibrillation: Secondary | ICD-10-CM

## 2014-03-12 LAB — PROTIME-INR: INR: 2.7 — AB (ref 0.9–1.1)

## 2014-03-15 ENCOUNTER — Other Ambulatory Visit: Payer: Self-pay | Admitting: Interventional Cardiology

## 2014-03-26 ENCOUNTER — Encounter: Payer: Self-pay | Admitting: Interventional Cardiology

## 2014-04-17 ENCOUNTER — Encounter: Payer: Self-pay | Admitting: Physician Assistant

## 2014-04-17 ENCOUNTER — Telehealth: Payer: Self-pay | Admitting: Physician Assistant

## 2014-04-17 ENCOUNTER — Ambulatory Visit (INDEPENDENT_AMBULATORY_CARE_PROVIDER_SITE_OTHER): Payer: Medicare Other | Admitting: Physician Assistant

## 2014-04-17 ENCOUNTER — Encounter: Payer: Medicare Other | Admitting: Interventional Cardiology

## 2014-04-17 VITALS — BP 120/76 | HR 62 | Ht 67.0 in | Wt 165.0 lb

## 2014-04-17 DIAGNOSIS — I482 Chronic atrial fibrillation, unspecified: Secondary | ICD-10-CM | POA: Insufficient documentation

## 2014-04-17 DIAGNOSIS — I2510 Atherosclerotic heart disease of native coronary artery without angina pectoris: Secondary | ICD-10-CM

## 2014-04-17 DIAGNOSIS — I739 Peripheral vascular disease, unspecified: Secondary | ICD-10-CM

## 2014-04-17 DIAGNOSIS — I779 Disorder of arteries and arterioles, unspecified: Secondary | ICD-10-CM

## 2014-04-17 DIAGNOSIS — E785 Hyperlipidemia, unspecified: Secondary | ICD-10-CM

## 2014-04-17 DIAGNOSIS — I10 Essential (primary) hypertension: Secondary | ICD-10-CM

## 2014-04-17 NOTE — Patient Instructions (Signed)
Your physician has recommended you make the following change in your medication:    STOP ASPIRIN   Your physician recommends that you return for lab work in: [place] wants you to follow-up in: [place] will receive a reminder letter in the mail two months in advance. If you don't receive a letter, please call our office to schedule the follow-up appointment.

## 2014-04-17 NOTE — Progress Notes (Signed)
[place], Gregory Vaughn, [place]  [address] Phone: [phone] Fax:  [phone]  Date:  04/17/2014   Patient ID:  Gregory CAWOOD, DOB 10-Mar-1917, MRN [id number]   PCP:  Marton Redwood, MD  Cardiologist:  Tamala Julian   History of Present Illness: Gregory Vaughn is a 80 y.o. male with history of chronic atrial fibrillation, ?CAD (details unclear - daughter denies known history of), HTN, HLD, stroke x2 (last in 2011 felt due to AF, was not on [facility] at that time). He presents to clinic for his 1 year evaluation. The patient was due to see Dr. Tamala Julian today but due to a mixup in the schedule, I was asked to see him as an add-on.  He still lives at [facility] in assisted living. His daughter is here with him today. He also has a girlfriend that looks after him closely. He is extremely hard of hearing making communication difficult, but he adamantly denies any recent cardiac symptoms including chest pain, shortness of breath or palpitations. He says he is doing "great." He's had 3 mechanical falls over the last year without any significant injuries or bleeding episodes.  Recent Labs: 08/09/2013: Creatinine 1.10; Hemoglobin 13.3; Potassium 4.8   Wt Readings from Last 3 Encounters:  04/17/14 165 lb (74.844 kg)  04/01/13 162 lb (73.483 kg)     Past Medical History  Diagnosis Date  . Hypertension   . Chronic atrial fibrillation     Chronic  . Bladder cancer     remission  . Glaucoma   . Legally blind     left eye  . Carpal tunnel syndrome   . Vertigo   . Dyslipidemia   . Osteoarthritis   . Herpes infection     left eye  . Depression     major  . Rhinitis   . Colon polyp     last eval 04/2007  . BPH (benign prostatic hyperplasia)   . CAD (coronary artery disease)   . Carotid artery disease     a. 2011 notes indicate chronic LICA stenosis.  . Stroke     a. 2011 felt due to AF.    Current Outpatient Prescriptions  Medication Sig Dispense Refill  . aspirin 81 MG tablet Take  81 mg by mouth daily.      Marland Kitchen atorvastatin (LIPITOR) 20 MG tablet Take 20 mg by mouth daily.      . dorzolamide-timolol (COSOPT) 22.3-6.8 MG/ML ophthalmic solution Place 1 drop into the right eye 2 (two) times daily.      Marland Kitchen escitalopram (LEXAPRO) 10 MG tablet Take 10 mg by mouth daily.      . isosorbide mononitrate (IMDUR) 30 MG 24 hr tablet TAKE 1 TABLET DAILY  90 tablet  0  . latanoprost (XALATAN) 0.005 % ophthalmic solution Place 1 drop into the right eye at bedtime.      . metoprolol succinate (TOPROL-XL) 50 MG 24 hr tablet TAKE 1 TABLET DAILY, TAKE WITH OR IMMEDIATELY FOLLOWING A MEAL  90 tablet  0  . nitroGLYCERIN (NITROSTAT) 0.4 MG SL tablet Place 1 tablet (0.4 mg total) under the tongue every 5 (five) minutes as needed for chest pain.  25 tablet  3  . warfarin (COUMADIN) 2.5 MG tablet TAKE AS DIRECTED BY COUMADIN CLINIC  90 tablet  1   No current facility-administered medications for this visit.    Allergies:   Other and Penicillins   Social History:  The patient  reports that he  has never smoked. He does not have any smokeless tobacco history on file. He reports that he drinks alcohol.   Family History:  The patient's family history includes Heart attack in his father.   ROS:  Please see the history of present illness.    All other systems reviewed and negative.   PHYSICAL EXAM:  VS:  BP 120/76  Pulse 62  Ht 5\' 7"  (1.702 m)  Wt 165 lb (74.844 kg)  BMI 25.84 kg/m2 Well nourished elderly WM in no acute distress, kyphotic posture HEENT: normal Neck: no JVD Cardiac:  normal S1, S2; irregularly irregular; no murmur Lungs:  clear to auscultation bilaterally, no wheezing, rhonchi or rales Abd: soft, nontender, no hepatomegaly Ext: no edema, no significant ecchymosis Skin: warm and dry Neuro:  moves all extremities spontaneously, no focal abnormalities noted, mild pill rolling tremor right hand, able to walk without a walker  EKG:  Atrial fibrillation 61bpm TWI III and V6,  biphasic T wave appearance in V4-V5. These appear more significant from prior tracing - 07/2013 he did have similar lateral TWI  ASSESSMENT AND PLAN:  1. Chronic atrial fibrillation - rate controlled. I had a discussion about Coumadin with the patient and his daughter. He has had several falls, but no substantial injuries resulting from them. At this point we have to weigh fall risk with very high CHADSVASC score 6 with two prior embolic strokes. His daughter is very concerned about the idea of another stroke and does NOT want him to come off Coumadin at this time. I think this is reasonable for now. I told her we will have to continue to re-evaluate this if more falls occur. To reduce bleeding risk, we will discontinue aspirin at this time. His daughter doesn't think he has actually been taking this recently. Continue metoprolol and Coumadin. INR is followed in our clinic by lab draws from [place]. Will update his CBC - see below about lab slip. 2. CAD - unclear history. EKG is somewhat abnormal but no recent anginal symptoms. Favor conservative management given his age and overall frailty. Continue beta blocker, Imdur, and statin. See above re: aspirin. 3. HTN - controlled. 4. Dyslipidemia - continue statin. 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 I have asked his daughter to find out who we need to fax the lab slip to at [facility] since they usually draw his labs there. We will plan to check CMET, fasting lipids along with CBC as well. 5. Carotid artery disease - continue statin.  Dispo: F/u 6 months with Dr. Tamala Julian.  Signed, Melina Copa, PA-C  04/17/2014 12:31 PM

## 2014-04-17 NOTE — Telephone Encounter (Signed)
New problem   Calling to give fax # [phone]

## 2014-04-17 NOTE — Progress Notes (Signed)
This encounter was created in error - please disregard.

## 2014-04-23 ENCOUNTER — Ambulatory Visit (INDEPENDENT_AMBULATORY_CARE_PROVIDER_SITE_OTHER): Payer: Medicare Other | Admitting: Interventional Cardiology

## 2014-04-23 ENCOUNTER — Encounter: Payer: Self-pay | Admitting: Interventional Cardiology

## 2014-04-23 DIAGNOSIS — Z5181 Encounter for therapeutic drug level monitoring: Secondary | ICD-10-CM

## 2014-04-23 DIAGNOSIS — I4891 Unspecified atrial fibrillation: Secondary | ICD-10-CM | POA: Insufficient documentation

## 2014-04-23 LAB — PROTIME-INR: INR: 2.9 — AB (ref <=1.1)

## 2014-04-27 ENCOUNTER — Encounter: Payer: Self-pay | Admitting: Physician Assistant

## 2014-04-27 NOTE — Progress Notes (Signed)
I confirmed with Dr. Tamala Julian that we are OK to discontinue aspirin. His daughter was already aware of this recommendation at his last office visit. I had just wanted to make sure that Dr. Tamala Julian was on board with this plan and he agrees. Humaira Sculley PA-C

## 2014-05-21 ENCOUNTER — Other Ambulatory Visit: Payer: Self-pay | Admitting: Interventional Cardiology

## 2014-05-27 ENCOUNTER — Other Ambulatory Visit: Payer: Self-pay | Admitting: Interventional Cardiology

## 2014-06-04 ENCOUNTER — Ambulatory Visit (INDEPENDENT_AMBULATORY_CARE_PROVIDER_SITE_OTHER): Payer: Medicare Other | Admitting: Interventional Cardiology

## 2014-06-04 DIAGNOSIS — Z5181 Encounter for therapeutic drug level monitoring: Secondary | ICD-10-CM

## 2014-06-04 DIAGNOSIS — I4891 Unspecified atrial fibrillation: Secondary | ICD-10-CM

## 2014-06-04 LAB — PROTIME-INR: INR: 4.4 — AB (ref 0.9–1.1)

## 2014-06-17 LAB — PROTIME-INR: INR: 4.8 — AB (ref 0.9–1.1)

## 2014-06-18 ENCOUNTER — Ambulatory Visit (INDEPENDENT_AMBULATORY_CARE_PROVIDER_SITE_OTHER): Payer: Medicare Other | Admitting: Pharmacist

## 2014-06-18 DIAGNOSIS — I4891 Unspecified atrial fibrillation: Secondary | ICD-10-CM

## 2014-06-18 DIAGNOSIS — Z5181 Encounter for therapeutic drug level monitoring: Secondary | ICD-10-CM

## 2014-07-02 ENCOUNTER — Ambulatory Visit (INDEPENDENT_AMBULATORY_CARE_PROVIDER_SITE_OTHER): Payer: Medicare Other | Admitting: Interventional Cardiology

## 2014-07-02 DIAGNOSIS — I4891 Unspecified atrial fibrillation: Secondary | ICD-10-CM

## 2014-07-02 DIAGNOSIS — Z5181 Encounter for therapeutic drug level monitoring: Secondary | ICD-10-CM

## 2014-07-02 LAB — PROTIME-INR: INR: 2.7 — AB (ref 0.9–1.1)

## 2014-07-07 ENCOUNTER — Telehealth: Payer: Self-pay | Admitting: Interventional Cardiology

## 2014-07-07 NOTE — Telephone Encounter (Signed)
New message    [facility] calling has questions regarding taking cipro . Since he taken coumadin.

## 2014-07-07 NOTE — Telephone Encounter (Signed)
Lauren from [facility] called in reference to the patient taking Cipro 250 mg twice a day for 7 days. Instructed her that the patient can take the medication and that we will follow up with the patient and recheck his INR sooner since there is a potential reaction. Called the patient and spoke with the daughter-Cindy and instructed her to instruct the patient to continue taking his coumadin as scheduled and that we will recheck his INR on 07/13/14, she verbalized understanding, and orders were faxed to [facility].

## 2014-07-16 ENCOUNTER — Ambulatory Visit (INDEPENDENT_AMBULATORY_CARE_PROVIDER_SITE_OTHER): Payer: Medicare Other | Admitting: Interventional Cardiology

## 2014-07-16 DIAGNOSIS — I4891 Unspecified atrial fibrillation: Secondary | ICD-10-CM

## 2014-07-16 DIAGNOSIS — Z5181 Encounter for therapeutic drug level monitoring: Secondary | ICD-10-CM

## 2014-07-16 LAB — PROTIME-INR: INR: 2.7 — AB (ref 0.9–1.1)

## 2014-07-28 ENCOUNTER — Other Ambulatory Visit: Payer: Self-pay | Admitting: Interventional Cardiology

## 2014-08-06 ENCOUNTER — Other Ambulatory Visit: Payer: Self-pay | Admitting: Interventional Cardiology

## 2014-08-24 ENCOUNTER — Telehealth: Payer: Self-pay | Admitting: Pharmacist

## 2014-08-24 NOTE — Telephone Encounter (Signed)
Spoke with pt's daughter.  Pt has been in rehab at [facility] for ~1 month and is transferring back to assisted living.  He was discharged on 1.5mg  every day except 2mg  on Monday and Friday.  He has 2.5mg  tablets at home.  Will change him to 1.25mg  every day except 2.5mg  on Wed and Sat  (11.5mg  versus 11.25mg ).  There is an order to check INR on 3/10.  Have left a message with the nurse at [facility] to confirm they will send results to our office.

## 2014-08-27 ENCOUNTER — Ambulatory Visit (INDEPENDENT_AMBULATORY_CARE_PROVIDER_SITE_OTHER): Payer: TRICARE For Life (TFL) | Admitting: Internal Medicine

## 2014-08-27 DIAGNOSIS — I4891 Unspecified atrial fibrillation: Secondary | ICD-10-CM

## 2014-08-27 DIAGNOSIS — Z5181 Encounter for therapeutic drug level monitoring: Secondary | ICD-10-CM

## 2014-08-27 LAB — PROTIME-INR: INR: 1.8 — AB (ref 0.9–1.1)

## 2014-09-03 ENCOUNTER — Ambulatory Visit (INDEPENDENT_AMBULATORY_CARE_PROVIDER_SITE_OTHER): Payer: TRICARE For Life (TFL) | Admitting: Cardiology

## 2014-09-03 DIAGNOSIS — Z5181 Encounter for therapeutic drug level monitoring: Secondary | ICD-10-CM

## 2014-09-03 DIAGNOSIS — I4891 Unspecified atrial fibrillation: Secondary | ICD-10-CM

## 2014-09-03 LAB — PROTIME-INR: INR: 2.3 — AB (ref 0.9–1.1)

## 2014-09-17 ENCOUNTER — Ambulatory Visit (INDEPENDENT_AMBULATORY_CARE_PROVIDER_SITE_OTHER): Payer: TRICARE For Life (TFL) | Admitting: Cardiology

## 2014-09-17 DIAGNOSIS — I4891 Unspecified atrial fibrillation: Secondary | ICD-10-CM

## 2014-09-17 DIAGNOSIS — Z5181 Encounter for therapeutic drug level monitoring: Secondary | ICD-10-CM

## 2014-09-17 LAB — PROTIME-INR: INR: 2.7 — AB (ref 0.9–1.1)

## 2014-10-07 ENCOUNTER — Other Ambulatory Visit: Payer: Self-pay | Admitting: Interventional Cardiology

## 2014-10-08 ENCOUNTER — Ambulatory Visit (INDEPENDENT_AMBULATORY_CARE_PROVIDER_SITE_OTHER): Payer: TRICARE For Life (TFL) | Admitting: Internal Medicine

## 2014-10-08 DIAGNOSIS — Z5181 Encounter for therapeutic drug level monitoring: Secondary | ICD-10-CM

## 2014-10-08 DIAGNOSIS — I4891 Unspecified atrial fibrillation: Secondary | ICD-10-CM

## 2014-10-08 LAB — PROTIME-INR: INR: 2.2 — AB (ref 0.9–1.1)

## 2014-11-03 ENCOUNTER — Other Ambulatory Visit: Payer: Self-pay | Admitting: Interventional Cardiology

## 2014-11-05 ENCOUNTER — Ambulatory Visit (INDEPENDENT_AMBULATORY_CARE_PROVIDER_SITE_OTHER): Payer: TRICARE For Life (TFL) | Admitting: Cardiology

## 2014-11-05 DIAGNOSIS — I4891 Unspecified atrial fibrillation: Secondary | ICD-10-CM

## 2014-11-05 DIAGNOSIS — Z5181 Encounter for therapeutic drug level monitoring: Secondary | ICD-10-CM

## 2014-11-05 LAB — PROTIME-INR: INR: 2.5 — AB (ref <=1.1)

## 2014-12-03 ENCOUNTER — Ambulatory Visit (INDEPENDENT_AMBULATORY_CARE_PROVIDER_SITE_OTHER): Payer: Medicare Other | Admitting: Internal Medicine

## 2014-12-03 DIAGNOSIS — I4891 Unspecified atrial fibrillation: Secondary | ICD-10-CM | POA: Diagnosis not present

## 2014-12-03 DIAGNOSIS — Z5181 Encounter for therapeutic drug level monitoring: Secondary | ICD-10-CM

## 2014-12-03 LAB — PROTIME-INR: INR: 1.8 — AB (ref 0.9–1.1)

## 2014-12-17 ENCOUNTER — Ambulatory Visit (INDEPENDENT_AMBULATORY_CARE_PROVIDER_SITE_OTHER): Payer: TRICARE For Life (TFL) | Admitting: Internal Medicine

## 2014-12-17 DIAGNOSIS — I4891 Unspecified atrial fibrillation: Secondary | ICD-10-CM

## 2014-12-17 DIAGNOSIS — Z5181 Encounter for therapeutic drug level monitoring: Secondary | ICD-10-CM

## 2014-12-17 LAB — PROTIME-INR: INR: 2.4 — AB (ref 0.9–1.1)

## 2015-01-01 ENCOUNTER — Other Ambulatory Visit: Payer: Self-pay | Admitting: Interventional Cardiology

## 2015-01-04 ENCOUNTER — Other Ambulatory Visit: Payer: Self-pay | Admitting: Interventional Cardiology

## 2015-01-07 ENCOUNTER — Ambulatory Visit (INDEPENDENT_AMBULATORY_CARE_PROVIDER_SITE_OTHER): Payer: TRICARE For Life (TFL) | Admitting: Cardiology

## 2015-01-07 DIAGNOSIS — Z5181 Encounter for therapeutic drug level monitoring: Secondary | ICD-10-CM

## 2015-01-07 DIAGNOSIS — I4891 Unspecified atrial fibrillation: Secondary | ICD-10-CM

## 2015-01-07 LAB — PROTIME-INR: INR: 2 — AB (ref 0.9–1.1)

## 2015-01-18 ENCOUNTER — Telehealth: Payer: Self-pay | Admitting: Interventional Cardiology

## 2015-01-18 ENCOUNTER — Ambulatory Visit: Payer: Self-pay | Admitting: Pharmacist

## 2015-01-18 DIAGNOSIS — Z5181 Encounter for therapeutic drug level monitoring: Secondary | ICD-10-CM

## 2015-01-18 DIAGNOSIS — I4891 Unspecified atrial fibrillation: Secondary | ICD-10-CM

## 2015-01-18 NOTE — Telephone Encounter (Signed)
Spoke with [place] with the [place] unit of [facility].  The pharmacist at the facility is managing Coumadin now.  His INR on 7/30 was 1.4.  He is now taking 1.5mg  alternating with 2mg  daily.  Daughter is aware that [facility] is managing.

## 2015-01-18 NOTE — Telephone Encounter (Signed)
Pt's dtr calling re pt having a lab order to go to [facility] and pt has been moved to a [facility] unit and needs to cancel all lab orders if it was our office that ordered it, they have a lab there pls advise dtr cindy

## 2015-01-18 NOTE — Telephone Encounter (Signed)
Returned pt daughter's call Adv her that the only lab that is in standing order to be drawn is thr pt's INR. She should instruct the new facility to contact the Coumadin clinic with the readings for further instructions on Coumadin adjustments She verbalized understanding.

## 2015-04-03 ENCOUNTER — Other Ambulatory Visit: Payer: Self-pay | Admitting: Interventional Cardiology

## 2015-05-02 ENCOUNTER — Other Ambulatory Visit: Payer: Self-pay | Admitting: Interventional Cardiology

## 2015-05-21 ENCOUNTER — Other Ambulatory Visit: Payer: Self-pay | Admitting: Interventional Cardiology

## 2015-06-20 DEATH — deceased

## 2016-02-23 IMAGING — CR DG LUMBAR SPINE COMPLETE 4+V
5 series · 5 of 5 positions shown · non-contrast
Comparison: Prior exams, including chest radiograph 01/09/2013

CLINICAL DATA: Fall, right low back pain

EXAM:
LUMBAR SPINE - COMPLETE 4+ VIEW

[t l-spine a.p.]
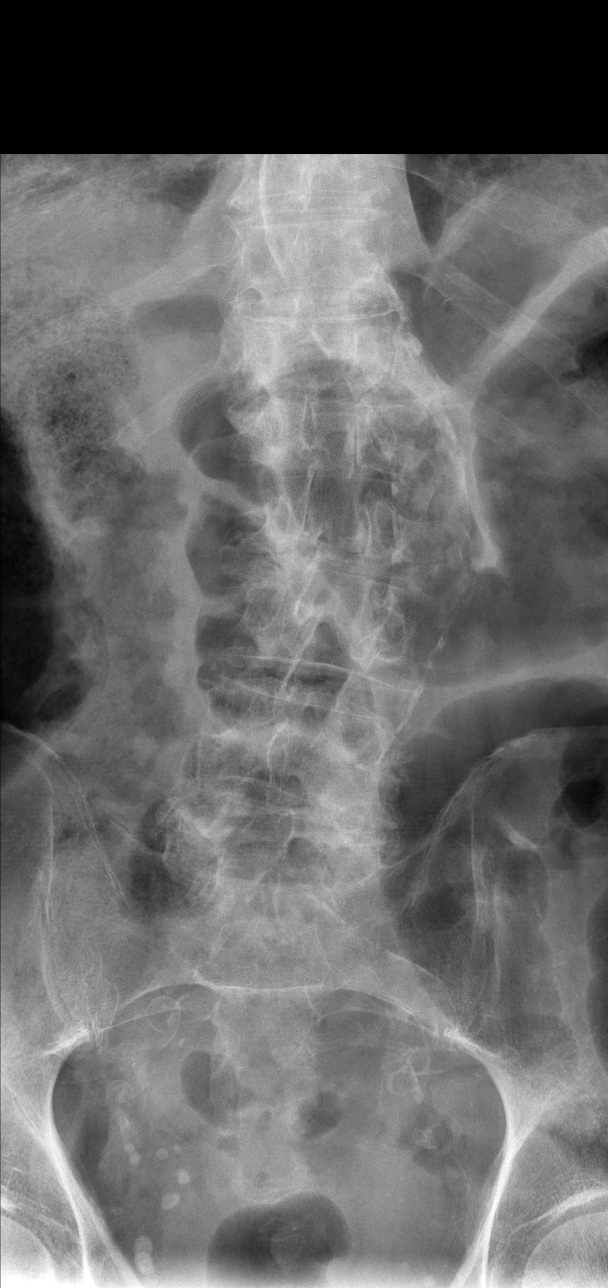

[t l-spine oblique exposure (1 of 2)]
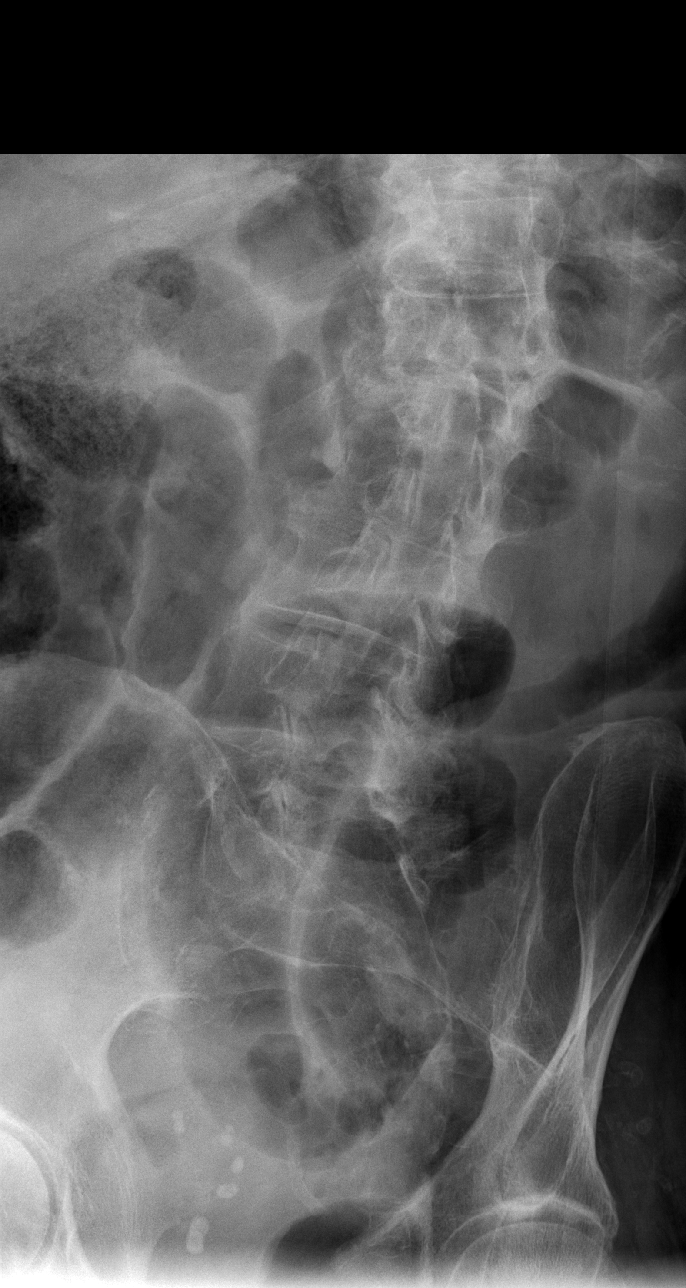

[t l-spine oblique exposure (2 of 2)]
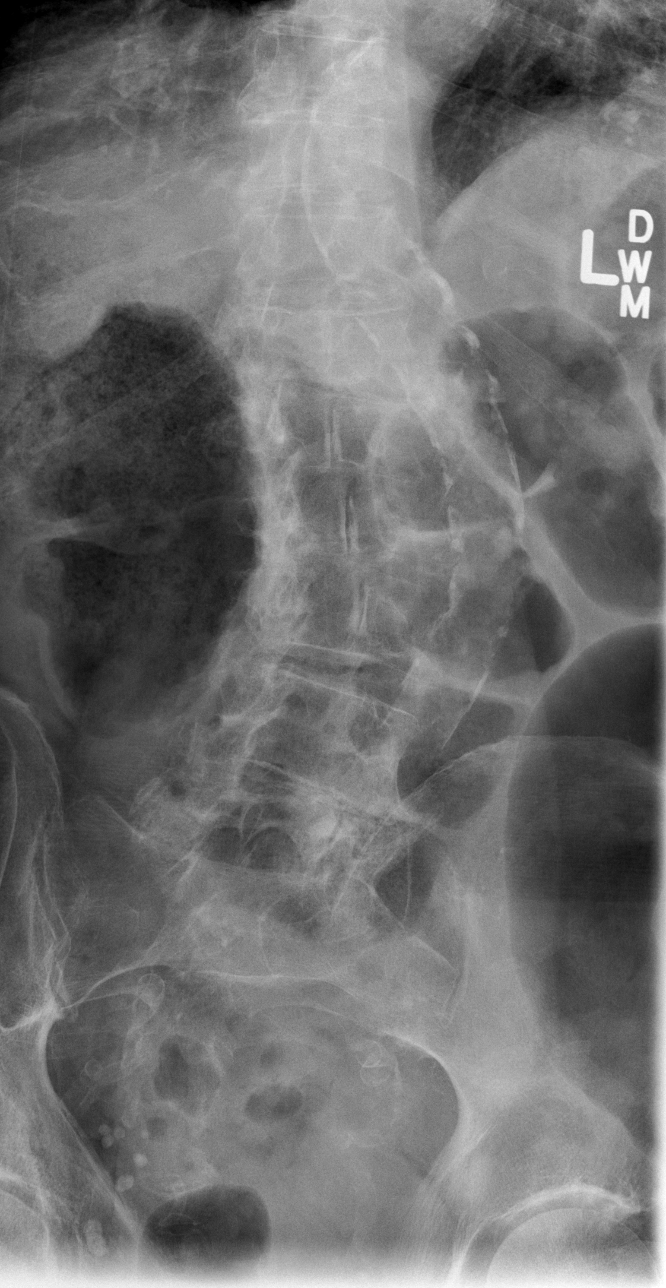

[t l-spine lat]
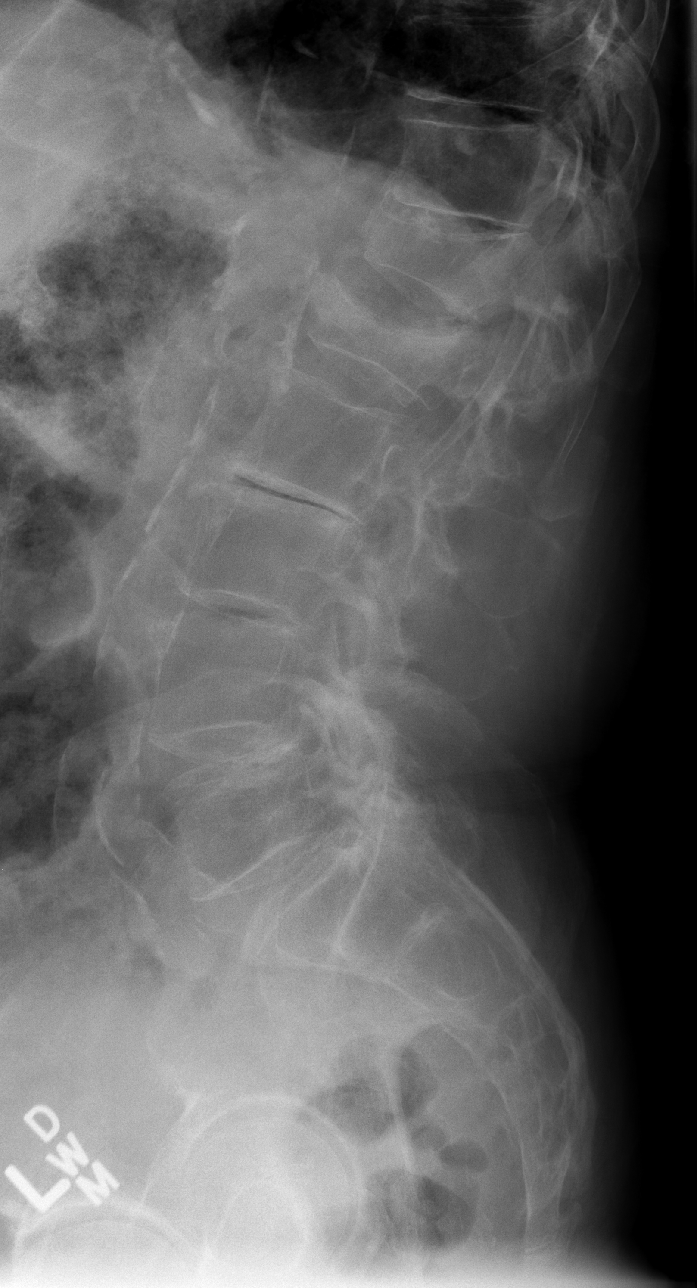

[t l-spine l5-s1 spot]
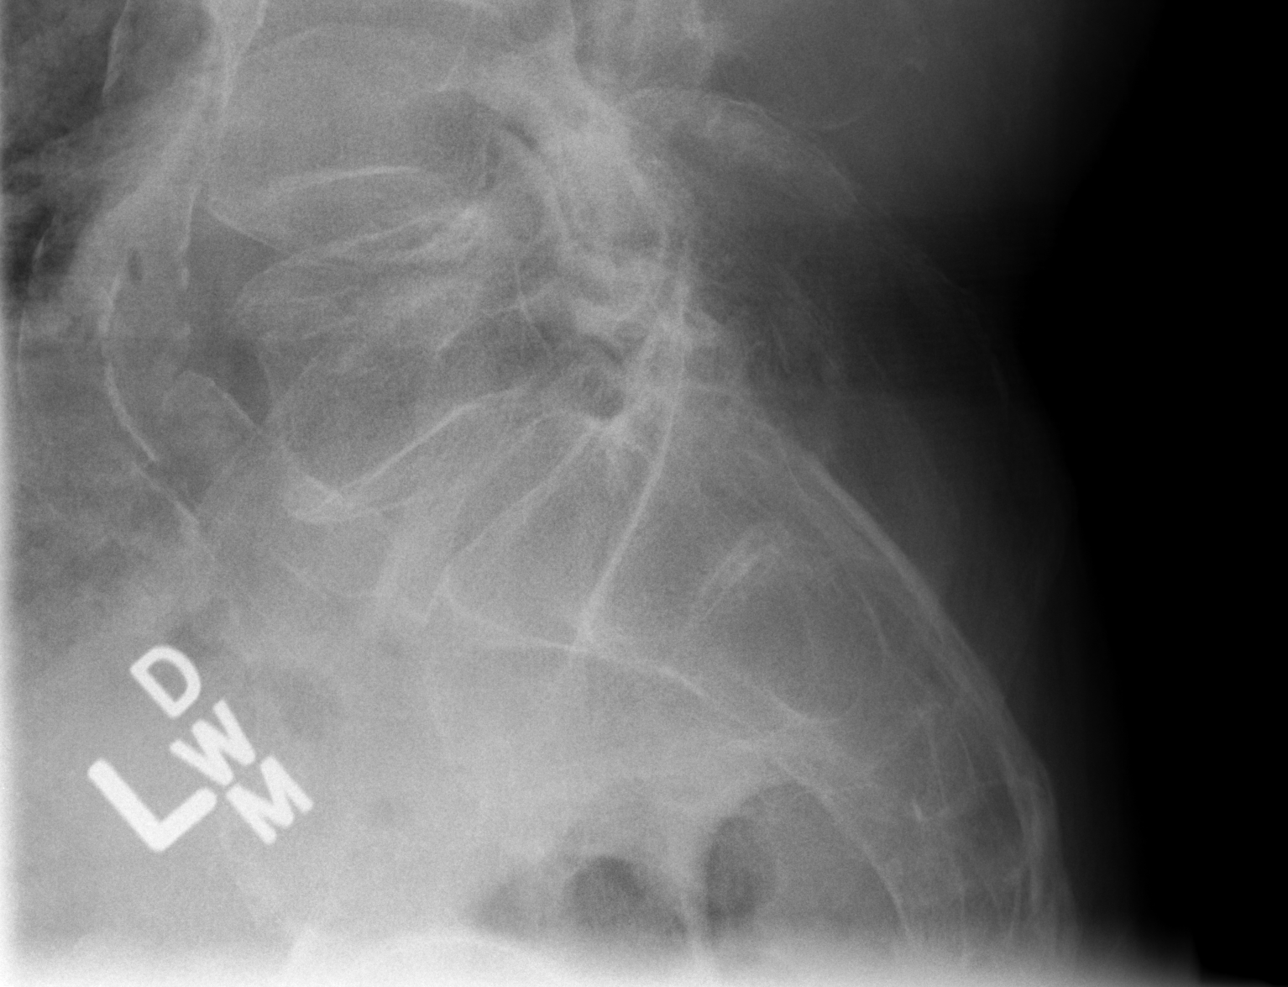

[5 of 5 positions shown; findings below may reference images not displayed]

FINDINGS: T12 compression deformity reidentified. L1 compression deformity is
stable when allowing for differences in technique. Atheromatous
aortic calcification without calcified aneurysm. Multilevel disc
degenerative change identified.
IMPRESSION: Stable T12 and L1 compression deformities.
